# Patient Record
Sex: Male | Born: 1970 | Race: White | Hispanic: No | Marital: Married | Smoking: Former smoker
Health system: Southern US, Community
[De-identification: ages and names within clinical notes are randomized; demographics above are authoritative.]

## PROBLEM LIST (undated history)

## (undated) DIAGNOSIS — T7840XA Allergy, unspecified, initial encounter: Secondary | ICD-10-CM

## (undated) DIAGNOSIS — IMO0002 Reserved for concepts with insufficient information to code with codable children: Secondary | ICD-10-CM

## (undated) DIAGNOSIS — E119 Type 2 diabetes mellitus without complications: Secondary | ICD-10-CM

## (undated) DIAGNOSIS — M1990 Unspecified osteoarthritis, unspecified site: Secondary | ICD-10-CM

## (undated) DIAGNOSIS — I2510 Atherosclerotic heart disease of native coronary artery without angina pectoris: Secondary | ICD-10-CM

## (undated) DIAGNOSIS — K219 Gastro-esophageal reflux disease without esophagitis: Secondary | ICD-10-CM

## (undated) HISTORY — DX: Unspecified osteoarthritis, unspecified site: M19.90

## (undated) HISTORY — DX: Allergy, unspecified, initial encounter: T78.40XA

## (undated) HISTORY — DX: Reserved for concepts with insufficient information to code with codable children: IMO0002

## (undated) HISTORY — PX: HERNIA REPAIR: SHX51

## (undated) HISTORY — DX: Gastro-esophageal reflux disease without esophagitis: K21.9

## (undated) HISTORY — DX: Atherosclerotic heart disease of native coronary artery without angina pectoris: I25.10

## (undated) HISTORY — PX: SHOULDER SURGERY: SHX246

---

## 2001-01-22 ENCOUNTER — Emergency Department (HOSPITAL_COMMUNITY): Admission: EM | Admit: 2001-01-22 | Discharge: 2001-01-22 | Payer: Self-pay | Admitting: Emergency Medicine

## 2001-01-22 ENCOUNTER — Encounter: Payer: Self-pay | Admitting: Emergency Medicine

## 2001-01-25 ENCOUNTER — Ambulatory Visit (HOSPITAL_COMMUNITY): Admission: RE | Admit: 2001-01-25 | Discharge: 2001-01-25 | Payer: Self-pay | Admitting: Orthopedic Surgery

## 2006-12-29 ENCOUNTER — Ambulatory Visit (HOSPITAL_COMMUNITY): Admission: RE | Admit: 2006-12-29 | Discharge: 2006-12-30 | Payer: Self-pay | Admitting: Orthopedic Surgery

## 2010-06-08 NOTE — Op Note (Signed)
NAME:  Tim Richardson, Tim Richardson NO.:  [id number]   MEDICAL RECORD NO.:  [id number]          PATIENT TYPE:  OIB   LOCATION:  5013                         FACILITY:  [facility]   PHYSICIAN:  Dyke Brackett, M.D.    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   PREOPERATIVE DIAGNOSES:  1. Impingement.  2. Partial infraspinatus tear.  3. Labral tear.   POSTOPERATIVE DIAGNOSES:  1. Impingement.  2. Partial infraspinatus tear.  3. Labral tear.   OPERATION:  1. Arthroscopic acromioplasty.  2. Arthroscopic debridement, torn labrum.   SURGEON:  Dyke Brackett, M.D.   ANESTHESIA:  Is general with a block.   DESCRIPTION OF PROCEDURE:  Examination under anesthesia showed no  instability and full range of motion.  He was arthroscoped through a  posterior, lateral and anterior portal.  Intra-articularly, he had  fraying type tear of the labrum anterior to superior with good stability  of the biceps that was debrided.  He had a partial infraspinatus tear  seen on the MRI involving probably 30% of the width and thickness of the  infraspinatus which was debrided.  Supraspinatus was normal.  There was  no sign of any degenerative change or Hill-Sachs or Bankart lesion.   Subacromial space was entered.  It was hypertrophied and certainly tight  from a very thickened probably type 2 to 3 acromion.  Acromioplasty was  carried out resecting the anterior leading edge of the acromion  relieving the impingement nicely.  There was no evidence of full-  thickness components of the infraspinatus and a mild amount of abrasion  on the edge of the acromion consistent clinically with impingement.  Shoulder drained free of fluid.  Portals closed  with nylon and placed in  a lightly compressive dressing and sling, taken to recovery in stable  condition.     Dyke Brackett, M.D.  Electronically Signed    WDC/MEDQ  D:  12/29/2006  T:  12/30/2006  Job:  [id number]

## 2010-06-11 NOTE — H&P (Signed)
[facility]  Patient:    Tim Richardson, Tim Richardson Visit Number: [id number] MRN: [id number]          Service Type: DSU Location: DAY Attending Physician:  Dominica Severin Dictated by:   Dorie Rank, P.A. Admit Date:  01/25/2001                           History and Physical  CHIEF COMPLAINT:  Right hand pain.  HISTORY OF PRESENT ILLNESS:  Mr. Ezequiel Kayser is a pleasant 40 year old male who on January 16, 2001, sustained a puncture wound with a nail to the mid metatarsal metacarpal third on the right.  Since that time, he has had increased pain and swelling.  He was seen at his family doctors office and received a tetanus.  He was seen in the ER on January 22, 2001, where x-rays showed significant soft tissue swelling without evidence of foreign body, soft tissue gas, or fracture.  He was placed on oral Keflex and seen in the office today by Elisha Ponder, M.D., for follow-up on January 26, 2000.  He had significant pain and limitation with flexion to his third finger.  It was felt that he would benefit from undergoing an I&D to the third finger.  ALLERGIES:  No known drug allergies.  MEDICATIONS:  Vicodin and Keflex.  PAST MEDICAL HISTORY:  Significant for hiatal hernia.  He has a history of chronic prostate infections for the past five or six years.  No problems recently.  PAST SURGICAL HISTORY:  Negative.  SOCIAL HISTORY:  The patient is married and has one child.  He smokes one pack per day.  He denies any alcohol use.  FAMILY HISTORY:  Mother living, age 46, with a history of hypertension. Father living, age 28.  He does not know his medical history.  REVIEW OF SYSTEMS:  General:  No fevers, chills, night sweats, or bleeding tendencies.  Pulmonary:  Recent nasal congestion related to seasonal allergies, resolved now.  No shortness of breath or productive cough. Cardiovascular:  No chest pain, angina, or orthopnea.  Endocrine:  No hypothyroidism or  hyperthyroidism.  No history of diabetes mellitus. Neurologic:  No seizures, headaches, or paralysis.  GI:  No nausea, vomiting, diarrhea, constipation, or melena.  GU:  No hematuria, dysuria, or discharge.  PHYSICAL EXAMINATION:  GENERAL APPEARANCE:  An alert and oriented 41 year old male, well developed and well nourished.  VITAL SIGNS:  Pulse 80, respirations 18, blood pressure 110/80.  HEENT:  The head is normocephalic and atraumatic.  The oropharynx is clear. No postnasal drainage noted.  NECK:  Supple.  Negative for carotid bruits bilaterally.  CHEST:  The lungs are clear to auscultation bilaterally.  No rhonchi, rales, or wheezes.  BREASTS:  Not pertinent to present illness.  ADENOPATHY:  Negative for cervical or epitrochlear lymphadenopathy.  HEART:  S1 and S2 negative for murmurs, rubs, or gallops.  The heart is regular in rate and rhythm.  ABDOMEN:  Soft and nontender.  Positive bowel sounds.  GENITOURINARY:  Not pertinent to present illness.  EXTREMITIES:  He has a small healing laceration to the mid third metacarpal. He has pain with flexion of the middle finger and he can flex to approximately 20 degrees.  There is no purulent drainage from this laceration.  SKIN:  Otherwise intact other than what was discussed up above.  There is no erythema extending into the forearm.  LABORATORY DATA AND X-RAYS:  Pending at this  time.  IMPRESSION:  Tenosynovitis of the middle finger of the right hand, status post puncture wound on January 16, 2001.  PLAN:  The patient will be taken to the OR today for an I&D of his third finger and antibiotic therapy by Elisha Ponder, M.D. Dictated by:   Dorie Rank, P.A. Attending Physician:  Dominica Severin DD:  01/25/01 TD:  01/25/01 Job: [id number]

## 2010-06-11 NOTE — Op Note (Signed)
[facility]  Patient:    Tim Richardson, Tim Richardson Visit Number: [id number] MRN: [id number]          Service Type: DSU Location: DAY Attending Physician:  Dominica Severin Dictated by:   Dominica Severin III, M.D. Proc. Date: 01/25/01 Admit Date:  01/25/2001                             Operative Report  DATE OF BIRTH:  01/04/1971  PREOPERATIVE DIAGNOSES:  Status post nail injury to the right dorsal third MCP joint seven days ago with continued pain and loss of motion.  POSTOPERATIVE DIAGNOSES:  Status post nail injury to the right dorsal third MCP joint seven days ago with continued pain and loss of motion.  PROCEDURE:  Incision and drainage right third MCP joint about the upper extremity with soft tissue I&D and extensor tendon sheath I&D as well.  SURGEON:  Dominica Severin, M.D.  ASSISTANT:  None.  COMPLICATIONS:  None.  Cultures x 1 taken for aerobic and anaerobic cultures.  ESTIMATED BLOOD LOSS:  Minimal.  TOURNIQUET TIME:  Less than 30 minutes.  INDICATIONS FOR PROCEDURE:  The patient is a very pleasant 40 year old male who presents with the above mentioned diagnosis. The patient punctured his hand on Christmas Eve. He was seen in Urgent Care four to five days later and given a tetanus shot. He subsequently presented to [facility] due to increasing pain and swelling two days ago and was referred to my care. I saw him for the first time today, January 25, 2001. Given the swelling, duration of symptoms and location of the nail puncture at the MCP joint, I recommended I&D. He has pain on active and passive range of motion localized the MCP joint. He has no numbness or tingling, no signs of compartment syndrome. He has had increasing problems with the finger. I have discussed with him alternatives of care, risks and benefits of surgery, etc. at length and he desires to proceed. All questions have been encouraged and answered.  OPERATIVE  FINDINGS:  This patient had encroachment of the third MCP joint with the nail. The tract was dissected down to the MCP joint. Following this, the MCP joint underwent an I&D. There was a large amount of fluid and hypertrophic synovitis in the MCP joint. This was debrided and a capsulotomy, of course, performed. I I&Dd the area copiously, placed two vessiloops for drains.  DESCRIPTION OF PROCEDURE:  The patient was seen by myself and anesthesia. He was taken to the operative suite and underwent smooth induction of general anesthesia under the direction of Dr. Lucious Groves. He was then laid supine, appropriately padded and prepped and draped in the usual sterile fashion. The patient had an incision made after the arm was elevated and tourniquet was insufflated approximately 1 inch about the radial aspect of the third dorsal MCP joint. I removed the tract where the nail had previously penetrated the skin. I followed this straight down to the MCP joint where it encroached upon the MCP joint just in the area about the proximal sagittal band. I then lifted the sagittal band up carefully as well as the extensor tendon, performed a capsulotomy and removed a portion of the dorsal capsule about the finger. I then removed a large amount of hypertrophic synovium and took cultures. The patient has been on Keflex I should note. Following taking the culture, I then performed a I&D of subcutaneous  tissue and identified the extensor tendon sheath. The extensor tendon sheath did not have obvious pus in it. I did open it up and examine it. I was happy with the findings and the inspection and at this point applied copious irrigation to the MCP joint as well as extensor tendon and soft tissues. Copious amounts of irrigant were placed in the wound, the tourniquet was deflated and antibiotics in the form of 1 gm of Rocephin was given at that time. The patient had the joint ranged during the irrigation portion of the  procedure. This was done to my satisfaction. Following an excellent I&D, the patient then had two vessiloops placed in the joint and the wound loosely closed. He tolerated this well. Marcaine without epinephrine was placed in the skin for postop analgesia. Once the sterile dressing and volar splint was placed, the patient then was transferred to the recovery room in stable condition after extubation. All sponge, needle and instrument counts were reported as correct. There were no complications. Will monitor his condition closely. He is going to go home on Augmentin 875 mg one p.o. b.i.d. I will await cultures. I have also given him pain medicine to be taken as directed. All questions have been encouraged and answered. Dictated by:   Dominica Severin III, M.D. Attending Physician:  Dominica Severin DD:  01/25/01 TD:  01/26/01 Job: [id number]

## 2010-11-01 LAB — DIFFERENTIAL
Basophils Absolute: 0.1
Basophils Relative: 1
Eosinophils Absolute: 0.2
Eosinophils Relative: 3
Lymphocytes Relative: 40
Lymphs Abs: 2.9
Monocytes Absolute: 0.6
Monocytes Relative: 8
Neutro Abs: 3.5
Neutrophils Relative %: 48

## 2010-11-01 LAB — URINALYSIS, ROUTINE W REFLEX MICROSCOPIC
Bilirubin Urine: NEGATIVE
Glucose, UA: NEGATIVE
Hgb urine dipstick: NEGATIVE
Ketones, ur: NEGATIVE
Nitrite: NEGATIVE
Protein, ur: NEGATIVE
Specific Gravity, Urine: 1.017
Urobilinogen, UA: 0.2
pH: 7

## 2010-11-01 LAB — BASIC METABOLIC PANEL
BUN: 9
CO2: 30
Calcium: 9.5
Chloride: 102
Creatinine, Ser: 1.02
GFR calc Af Amer: >60
GFR calc non Af Amer: >60
Glucose, Bld: 100 — ABNORMAL HIGH
Potassium: 4.4
Sodium: 140

## 2010-11-01 LAB — CBC
HCT: 43.7
Hemoglobin: 14.9
MCHC: 34
MCV: 84.6
Platelets: 207
RBC: 5.17
RDW: 13.7
WBC: 7.2

## 2012-06-25 ENCOUNTER — Emergency Department: Payer: Self-pay | Admitting: Emergency Medicine

## 2012-06-25 LAB — COMPREHENSIVE METABOLIC PANEL
Albumin: 4.2 g/dL (ref 3.4–5.0)
Alkaline Phosphatase: 71 U/L (ref 50–136)
Anion Gap: 6 — ABNORMAL LOW (ref 7–16)
Bilirubin,Total: 0.3 mg/dL (ref 0.2–1.0)
Calcium, Total: 9.1 mg/dL (ref 8.5–10.1)
Chloride: 104 mmol/L (ref 98–107)
Glucose: 102 mg/dL — ABNORMAL HIGH (ref 65–99)
Osmolality: 280 (ref 275–301)
SGOT(AST): 29 U/L (ref 15–37)
Total Protein: 7.5 g/dL (ref 6.4–8.2)

## 2012-06-25 LAB — CBC
HCT: 43.3 % (ref 40.0–52.0)
HGB: 15 g/dL (ref 13.0–18.0)
MCH: 29 pg (ref 26.0–34.0)
MCHC: 34.7 g/dL (ref 32.0–36.0)
MCV: 84 fL (ref 80–100)
RDW: 14 % (ref 11.5–14.5)
WBC: 13.3 10*3/uL — ABNORMAL HIGH (ref 3.8–10.6)

## 2012-12-21 ENCOUNTER — Other Ambulatory Visit: Payer: Self-pay | Admitting: *Deleted

## 2012-12-21 ENCOUNTER — Ambulatory Visit (INDEPENDENT_AMBULATORY_CARE_PROVIDER_SITE_OTHER): Payer: 59 | Admitting: Physician Assistant

## 2012-12-21 VITALS — BP 116/74 | HR 94 | Temp 98.8°F | Resp 16 | Wt 254.8 lb

## 2012-12-21 DIAGNOSIS — J209 Acute bronchitis, unspecified: Secondary | ICD-10-CM

## 2012-12-21 MED ORDER — CEFDINIR 300 MG PO CAPS: 300.0000 mg | ORAL_CAPSULE | Freq: Two times a day (BID) | ORAL | Status: DC

## 2012-12-21 MED ORDER — GUAIFENESIN ER 1200 MG PO TB12: 1.0000 | ORAL_TABLET | Freq: Two times a day (BID) | ORAL | Status: AC

## 2012-12-21 MED ORDER — GUAIFENESIN ER 1200 MG PO TB12: 1.0000 | ORAL_TABLET | Freq: Two times a day (BID) | ORAL | Status: DC

## 2012-12-21 MED ORDER — HYDROCOD POLST-CHLORPHEN POLST 10-8 MG/5ML PO LQCR: 5.0000 mL | Freq: Two times a day (BID) | ORAL | Status: DC

## 2012-12-21 NOTE — Progress Notes (Signed)
   Subjective:    Patient ID: Tim Richardson, male    DOB: Apr 21, 1970, 42 y.o.   MRN: [id number]  HPI Pt presents to clinic with 2 week h/o cold symptoms - he is having some nasal congestion but it is mainly at night and sinus headaches - but the worse is his cough - it is mostly dry but it is deep.  He has no SOB or wheezing.    OTC med- tylenol cold prep - not much help Sick contacts at home. Review of Systems  Constitutional: Positive for fever (subjective). Negative for chills.  HENT: Positive for congestion (at night), ear pain, sinus pressure and sore throat (am only - then resolves).   Respiratory: Positive for cough (yellow - green - rare). Negative for shortness of breath and wheezing.        Former smoker. No h/o asthma  Musculoskeletal: Positive for myalgias.  Neurological: Positive for headaches.  Psychiatric/Behavioral: Positive for sleep disturbance (cough keeping him up at night).       Objective:   Physical Exam  Vitals reviewed. Constitutional: He is oriented to person, place, and time. He appears well-developed and well-nourished.  HENT:  Head: Normocephalic and atraumatic.  Right Ear: Hearing, tympanic membrane, external ear and ear canal normal.  Left Ear: Hearing, tympanic membrane, external ear and ear canal normal.  Nose: Nose normal.  Mouth/Throat: Uvula is midline, oropharynx is clear and moist and mucous membranes are normal.  Eyes: Conjunctivae are normal.  Neck: Normal range of motion.  Cardiovascular: Normal rate, regular rhythm and normal heart sounds.   No murmur heard. Pulmonary/Chest: Effort normal. He has no wheezes.  Lymphadenopathy:    He has no cervical adenopathy.  Neurological: He is alert and oriented to person, place, and time.  Skin: Skin is warm and dry.  Psychiatric: He has a normal mood and affect. His behavior is normal. Judgment and thought content normal.       Assessment & Plan:  Acute bronchitis - Will cover for sinus and  bronchitis.  He is to push fluids.  PMG TB12, chlorpheniramine-HYDROcodone [facility] ER) 10-8 MG/5ML Mare Ferrari   Benny Lennert PA-C 12/21/2012 2:07 PM

## 2012-12-30 ENCOUNTER — Ambulatory Visit (INDEPENDENT_AMBULATORY_CARE_PROVIDER_SITE_OTHER): Payer: 59 | Admitting: Internal Medicine

## 2012-12-30 ENCOUNTER — Ambulatory Visit: Payer: 59

## 2012-12-30 VITALS — BP 110/72 | HR 97 | Temp 98.8°F | Resp 16 | Ht 74.7 in | Wt 252.0 lb

## 2012-12-30 DIAGNOSIS — J209 Acute bronchitis, unspecified: Secondary | ICD-10-CM

## 2012-12-30 DIAGNOSIS — R079 Chest pain, unspecified: Secondary | ICD-10-CM

## 2012-12-30 DIAGNOSIS — R05 Cough: Secondary | ICD-10-CM

## 2012-12-30 DIAGNOSIS — R069 Unspecified abnormalities of breathing: Secondary | ICD-10-CM

## 2012-12-30 LAB — POCT CBC
Lymph, poc: 1.5 (ref 0.6–3.4)
MCH, POC: 28.7 pg (ref 27–31.2)
MCHC: 32.1 g/dL (ref 31.8–35.4)
MID (cbc): 0.5 (ref 0–0.9)
MPV: 9.8 fL (ref 0–99.8)
POC LYMPH PERCENT: 22.5 %L (ref 10–50)
POC MID %: 7.9 %M (ref 0–12)
Platelet Count, POC: 188 10*3/uL (ref 142–424)
RBC: 4.78 M/uL (ref 4.69–6.13)
RDW, POC: 13.9 %
WBC: 6.5 10*3/uL (ref 4.6–10.2)

## 2012-12-30 MED ORDER — HYDROCOD POLST-CHLORPHEN POLST 10-8 MG/5ML PO LQCR: 5.0000 mL | Freq: Two times a day (BID) | ORAL | Status: AC

## 2012-12-30 MED ORDER — AZITHROMYCIN 500 MG PO TABS: 500.0000 mg | ORAL_TABLET | Freq: Every day | ORAL | Status: DC

## 2012-12-30 NOTE — Progress Notes (Signed)
   Subjective:    Patient ID: Tim Richardson, male    DOB: September 26, 1970, 42 y.o.   MRN: [id number]  HPI Cough getting worse, now chest pain and copious green sputum. No sob.No hemoptysis.   Review of Systems     Objective:   Physical Exam  Vitals reviewed. Constitutional: He is oriented to person, place, and time. He appears well-developed and well-nourished. No distress.  Cardiovascular: Normal rate, regular rhythm and normal heart sounds.   Pulmonary/Chest: Effort normal. Not tachypneic. No respiratory distress. He has no decreased breath sounds. He has no wheezes. He has rhonchi. He has no rales.  Neurological: He is alert and oriented to person, place, and time. He exhibits normal muscle tone. Coordination normal.  Psychiatric: He has a normal mood and affect.     Results for orders placed in visit on 12/30/12  POCT CBC      Result Value Range   WBC 6.5  4.6 - 10.2 K/uL   Lymph, poc 1.5  0.6 - 3.4   POC LYMPH PERCENT 22.5  10 - 50 %L   MID (cbc) 0.5  0 - 0.9   POC MID % 7.9  0 - 12 %M   POC Granulocyte 4.5  2 - 6.9   Granulocyte percent 69.6  37 - 80 %G   RBC 4.78  4.69 - 6.13 M/uL   Hemoglobin 13.7 (*) 14.1 - 18.1 g/dL   HCT, POC 40.9 (*) 81.1 - 53.7 %   MCV 89.3  80 - 97 fL   MCH, POC 28.7  27 - 31.2 pg   MCHC 32.1  31.8 - 35.4 g/dL   RDW, POC 91.4     Platelet Count, POC 188  142 - 424 K/uL   MPV 9.8  0 - 99.8 fL   [facility] reading (PRIMARY) by  Dr Perrin Maltese no infiltrate       Assessment & Plan:  Zithromax 500mg /Tussionex cough

## 2012-12-30 NOTE — Patient Instructions (Signed)
Cough, Adult  A cough is a reflex that helps clear your throat and airways. It can help heal the body or may be a reaction to an irritated airway. A cough may only last 2 or 3 weeks (acute) or may last more than 8 weeks (chronic).  CAUSES Acute cough:  Viral or bacterial infections. Chronic cough:  Infections.  Allergies.  Asthma.  Post-nasal drip.  Smoking.  Heartburn or acid reflux.  Some medicines.  Chronic lung problems (COPD).  Cancer. SYMPTOMS   Cough.  Fever.  Chest pain.  Increased breathing rate.  High-pitched whistling sound when breathing (wheezing).  Colored mucus that you cough up (sputum). TREATMENT   A bacterial cough may be treated with antibiotic medicine.  A viral cough must run its course and will not respond to antibiotics.  Your caregiver may recommend other treatments if you have a chronic cough. HOME CARE INSTRUCTIONS   Only take over-the-counter or prescription medicines for pain, discomfort, or fever as directed by your caregiver. Use cough suppressants only as directed by your caregiver.  Use a cold steam vaporizer or humidifier in your bedroom or home to help loosen secretions.  Sleep in a semi-upright position if your cough is worse at night.  Rest as needed.  Stop smoking if you smoke. SEEK IMMEDIATE MEDICAL CARE IF:   You have pus in your sputum.  Your cough starts to worsen.  You cannot control your cough with suppressants and are losing sleep.  You begin coughing up blood.  You have difficulty breathing.  You develop pain which is getting worse or is uncontrolled with medicine.  You have a fever. MAKE SURE YOU:   Understand these instructions.  Will watch your condition.  Will get help right away if you are not doing well or get worse. Document Released: 07/09/2010 Document Revised: 04/04/2011 Document Reviewed: 07/09/2010 Exit[employer] Patient Information 2014 [employer]. Acute Bronchitis Bronchitis is  inflammation of the airways that extend from the windpipe into the lungs (bronchi). The inflammation often causes mucus to develop. This leads to a cough, which is the most common symptom of bronchitis.  In acute bronchitis, the condition usually develops suddenly and goes away over time, usually in a couple weeks. Smoking, allergies, and asthma can make bronchitis worse. Repeated episodes of bronchitis may cause further lung problems.  CAUSES Acute bronchitis is most often caused by the same virus that causes a cold. The virus can spread from person to person (contagious).  SIGNS AND SYMPTOMS   Cough.   Fever.   Coughing up mucus.   Body aches.   Chest congestion.   Chills.   Shortness of breath.   Sore throat.  DIAGNOSIS  Acute bronchitis is usually diagnosed through a physical exam. Tests, such as chest X-rays, are sometimes done to rule out other conditions.  TREATMENT  Acute bronchitis usually goes away in a couple weeks. Often times, no medical treatment is necessary. Medicines are sometimes given for relief of fever or cough. Antibiotics are usually not needed but may be prescribed in certain situations. In some cases, an inhaler may be recommended to help reduce shortness of breath and control the cough. A cool mist vaporizer may also be used to help thin bronchial secretions and make it easier to clear the chest.  HOME CARE INSTRUCTIONS  Get plenty of rest.   Drink enough fluids to keep your urine clear or pale yellow (unless you have a medical condition that requires fluid restriction). Increasing fluids may help   thin your secretions and will prevent dehydration.   Only take over-the-counter or prescription medicines as directed by your health care provider.   Avoid smoking and secondhand smoke. Exposure to cigarette smoke or irritating chemicals will make bronchitis worse. If you are a smoker, consider using nicotine gum or skin patches to help control  withdrawal symptoms. Quitting smoking will help your lungs heal faster.   Reduce the chances of another bout of acute bronchitis by washing your hands frequently, avoiding people with cold symptoms, and trying not to touch your hands to your mouth, nose, or eyes.   Follow up with your health care provider as directed.  SEEK MEDICAL CARE IF: Your symptoms do not improve after 1 week of treatment.  SEEK IMMEDIATE MEDICAL CARE IF:  You develop an increased fever or chills.   You have chest pain.   You have severe shortness of breath.  You have bloody sputum.   You develop dehydration.  You develop fainting.  You develop repeated vomiting.  You develop a severe headache. MAKE SURE YOU:   Understand these instructions.  Will watch your condition.  Will get help right away if you are not doing well or get worse. Document Released: 02/18/2004 Document Revised: 09/12/2012 Document Reviewed: 07/03/2012 Exit[employer] Patient Information 2014 [employer].  

## 2013-07-08 ENCOUNTER — Encounter (HOSPITAL_COMMUNITY): Payer: Self-pay | Admitting: Emergency Medicine

## 2013-07-08 ENCOUNTER — Emergency Department (HOSPITAL_COMMUNITY)
Admission: EM | Admit: 2013-07-08 | Discharge: 2013-07-08 | Disposition: A | Discharge status: Home / Self Care | Payer: Self-pay | Attending: Emergency Medicine | Admitting: Emergency Medicine

## 2013-07-08 DIAGNOSIS — R1012 Left upper quadrant pain: Secondary | ICD-10-CM | POA: Insufficient documentation

## 2013-07-08 DIAGNOSIS — Z87891 Personal history of nicotine dependence: Secondary | ICD-10-CM | POA: Insufficient documentation

## 2013-07-08 DIAGNOSIS — Z872 Personal history of diseases of the skin and subcutaneous tissue: Secondary | ICD-10-CM | POA: Insufficient documentation

## 2013-07-08 DIAGNOSIS — R109 Unspecified abdominal pain: Secondary | ICD-10-CM

## 2013-07-08 DIAGNOSIS — Z79899 Other long term (current) drug therapy: Secondary | ICD-10-CM | POA: Insufficient documentation

## 2013-07-08 DIAGNOSIS — R197 Diarrhea, unspecified: Secondary | ICD-10-CM | POA: Insufficient documentation

## 2013-07-08 DIAGNOSIS — K219 Gastro-esophageal reflux disease without esophagitis: Secondary | ICD-10-CM | POA: Insufficient documentation

## 2013-07-08 DIAGNOSIS — Z8739 Personal history of other diseases of the musculoskeletal system and connective tissue: Secondary | ICD-10-CM | POA: Insufficient documentation

## 2013-07-08 LAB — CBC WITH DIFFERENTIAL/PLATELET
Basophils Absolute: 0.1 10*3/uL (ref 0.0–0.1)
Basophils Relative: 1 % (ref 0–1)
EOS PCT: 3 % (ref 0–5)
Eosinophils Absolute: 0.2 10*3/uL (ref 0.0–0.7)
HEMATOCRIT: 43.3 % (ref 39.0–52.0)
Hemoglobin: 15 g/dL (ref 13.0–17.0)
LYMPHS ABS: 3 10*3/uL (ref 0.7–4.0)
Lymphocytes Relative: 41 % (ref 12–46)
MCH: 29.5 pg (ref 26.0–34.0)
MCHC: 34.6 g/dL (ref 30.0–36.0)
MCV: 85.1 fL (ref 78.0–100.0)
MONO ABS: 0.8 10*3/uL (ref 0.1–1.0)
Monocytes Relative: 11 % (ref 3–12)
NEUTROS ABS: 3.3 10*3/uL (ref 1.7–7.7)
Neutrophils Relative %: 44 % (ref 43–77)
Platelets: 201 10*3/uL (ref 150–400)
RBC: 5.09 MIL/uL (ref 4.22–5.81)
RDW: 13.7 % (ref 11.5–15.5)
WBC: 7.3 10*3/uL (ref 4.0–10.5)

## 2013-07-08 LAB — COMPREHENSIVE METABOLIC PANEL
ALK PHOS: 67 U/L (ref 39–117)
ALT: 56 U/L — ABNORMAL HIGH (ref 0–53)
AST: 32 U/L (ref 0–37)
Albumin: 4.1 g/dL (ref 3.5–5.2)
BUN: 15 mg/dL (ref 6–23)
CALCIUM: 9.6 mg/dL (ref 8.4–10.5)
CHLORIDE: 102 meq/L (ref 96–112)
CO2: 28 mEq/L (ref 19–32)
Creatinine, Ser: 1.18 mg/dL (ref 0.50–1.35)
GFR, EST AFRICAN AMERICAN: 85 mL/min — AB (ref >90)
GFR, EST NON AFRICAN AMERICAN: 74 mL/min — AB (ref >90)
GLUCOSE: 118 mg/dL — AB (ref 70–99)
Potassium: 5 mEq/L (ref 3.7–5.3)
Sodium: 140 mEq/L (ref 137–147)
Total Bilirubin: 0.3 mg/dL (ref 0.3–1.2)
Total Protein: 7.4 g/dL (ref 6.0–8.3)

## 2013-07-08 LAB — LIPASE, BLOOD: Lipase: 35 U/L (ref 11–59)

## 2013-07-08 NOTE — ED Notes (Signed)
Pt c/o generalized abdominal pain ongoing for 6 weeks. Symptoms increased past couple of days. Pt also reports that his stomach becomes bloated whenever he eats.

## 2013-07-08 NOTE — Discharge Instructions (Signed)

## 2013-07-08 NOTE — ED Provider Notes (Signed)
CSN: [id number]     Arrival date & time 07/08/13  0711 History   First MD Initiated Contact with Patient 07/08/13 [phone]     Chief Complaint  Patient presents with  . Abdominal Pain    Pt seen with medical student, I performed history/physical/documentation     Patient is a 43 y.o. male presenting with abdominal pain. The history is provided by the patient.  Abdominal Pain Pain location:  LUQ Pain quality: burning   Pain radiates to:  Does not radiate Pain severity:  Moderate Onset quality:  Gradual Duration:  6 weeks Timing:  Intermittent Progression:  Unchanged Relieved by:  Nothing Ineffective treatments:  Eating Associated symptoms: diarrhea   Associated symptoms: no chest pain, no dysuria, no fever, no hematochezia, no shortness of breath and no vomiting   Risk factors: no alcohol abuse and no NSAID use     Past Medical History  Diagnosis Date  . Allergy   . Arthritis   . GERD (gastroesophageal reflux disease)   . Ulcer    Past Surgical History  Procedure Laterality Date  . Hernia repair    . Shoulder surgery     Family History  Problem Relation Age of Onset  . Diabetes Mother   . Heart disease Mother   . Stroke Mother   . Heart disease Father   . Hyperlipidemia Father   . Heart disease Sister   . Mental retardation Sister    History  Substance Use Topics  . Smoking status: Former Smoker    Quit date: 12/21/2001  . Smokeless tobacco: Not on file  . Alcohol Use: No    Review of Systems  Constitutional: Negative for fever.  Respiratory: Negative for shortness of breath.   Cardiovascular: Negative for chest pain.  Gastrointestinal: Positive for abdominal pain and diarrhea. Negative for vomiting, blood in stool and hematochezia.  Genitourinary: Negative for dysuria.  All other systems reviewed and are negative.     Allergies  Review of patient's allergies indicates no known allergies.  Home Medications   Prior to Admission medications    Medication Sig Start Date End Date Taking? Authorizing Provider  acetaminophen (TYLENOL) 500 MG tablet Take 500 mg by mouth every 6 (six) hours as needed for headache.   Yes Historical Provider, MD  pantoprazole (PROTONIX) 20 MG tablet Take 40 mg by mouth 2 (two) times daily. May take addt'l  If needed in the evening   Yes Historical Provider, MD  Simethicone 125 MG CAPS Take 1-2 capsules by mouth every 6 (six) hours as needed (gas).   Yes Historical Provider, MD   BP 127/82  Pulse 83  Temp(Src) 97.7 F (36.5 C) (Oral)  Resp 18  Ht 6\' 3"  (1.905 m)  Wt 260 lb (117.935 kg)  BMI 32.50 kg/m2  SpO2 99% Physical Exam CONSTITUTIONAL: Well developed/well nourished HEAD: Normocephalic/atraumatic EYES: EOMI/PERRL, no icterus ENMT: Mucous membranes moist NECK: supple no meningeal signs SPINE:entire spine nontender CV: S1/S2 noted, no murmurs/rubs/gallops noted LUNGS: Lungs are clear to auscultation bilaterally, no apparent distress ABDOMEN: soft, nontender, no rebound or guarding, no hernia noted GU:no cva tenderness NEURO: Pt is awake/alert, moves all extremitiesx4 EXTREMITIES: pulses normal, full ROM SKIN: warm, color normal PSYCH: no abnormalities of mood noted  ED Course  Procedures   Advised PPI Referred to GI He is well appearing, I don't feel emergent imaging required at this time  Labs Review Labs Reviewed  COMPREHENSIVE METABOLIC PANEL - Abnormal; Notable for the following:  Glucose, Bld 118 (*)    ALT 56 (*)    GFR calc non Af Amer 74 (*)    GFR calc Af Amer 85 (*)    All other components within normal limits  CBC WITH DIFFERENTIAL  LIPASE, BLOOD      EKG Interpretation   Date/Time:  Monday July 08 2013 08:17:42 EDT Ventricular Rate:  75 PR Interval:  181 QRS Duration: 98 QT Interval:  384 QTC Calculation: 429 R Axis:   82 Text Interpretation:  Sinus rhythm Abnormal inferior Q waves Minimal ST  elevation, inferior leads No previous ECGs available  Confirmed by Bebe ShaggyWICKLINE   MD, Dorinda HillNALD ([id number]) on 07/08/2013 8:23:31 AM      MDM   Final diagnoses:  Abdominal pain    Nursing notes including past medical history and social history reviewed and considered in documentation Labs/vital reviewed and considered     Joya Gaskinsonald W Cordie Beazley, MD 07/08/13 [phone]

## 2013-07-23 ENCOUNTER — Ambulatory Visit: Payer: Self-pay

## 2015-04-14 ENCOUNTER — Ambulatory Visit (INDEPENDENT_AMBULATORY_CARE_PROVIDER_SITE_OTHER): Payer: PRIVATE HEALTH INSURANCE | Admitting: Family Medicine

## 2015-04-14 VITALS — BP 132/80 | HR 75 | Temp 97.9°F | Resp 18 | Ht 75.0 in | Wt 268.8 lb

## 2015-04-14 DIAGNOSIS — B9689 Other specified bacterial agents as the cause of diseases classified elsewhere: Secondary | ICD-10-CM

## 2015-04-14 DIAGNOSIS — J019 Acute sinusitis, unspecified: Secondary | ICD-10-CM

## 2015-04-14 MED ORDER — DOXYCYCLINE HYCLATE 100 MG PO CAPS: 100.0000 mg | ORAL_CAPSULE | Freq: Two times a day (BID) | ORAL | Status: DC

## 2015-04-14 NOTE — Progress Notes (Signed)
Tim Richardson is a 45 y.o. male who presents today for Sinus pressure.  Sinus pressure - Ongoing now for about 3 weeks with HA and sinus pressure.  Has not tried much for this other than mucinex and allergy medication.  Has helped a little bit.  No fever. Has had some nasal drainage that is greenish in color.  No Respiratory Sx.  Adjustment disorder with grief -  Pt lost daughter about 8 months ago.  Having hard time and denies SI/HI.  Trouble with daily life.  Not interested in therapy or medications.     Past Medical History  Diagnosis Date  . Allergy   . Arthritis   . GERD (gastroesophageal reflux disease)   . Ulcer     History  Smoking status  . Former Smoker  . Quit date: 12/21/2001  Smokeless tobacco  . Not on file    Family History  Problem Relation Age of Onset  . Diabetes Mother   . Heart disease Mother   . Stroke Mother   . Heart disease Father   . Hyperlipidemia Father   . Heart disease Sister   . Mental retardation Sister     Current Outpatient Prescriptions on File Prior to Visit  Medication Sig Dispense Refill  . acetaminophen (TYLENOL) 500 MG tablet Take 500 mg by mouth every 6 (six) hours as needed for headache.    . pantoprazole (PROTONIX) 20 MG tablet Take 40 mg by mouth 2 (two) times daily. Reported on 04/14/2015    . Simethicone 125 MG CAPS Take 1-2 capsules by mouth every 6 (six) hours as needed (gas). Reported on 04/14/2015     No current facility-administered medications on file prior to visit.    ROS: Per HPI.  All other systems reviewed and are negative.   Physical Exam Filed Vitals:   04/14/15 1622  BP: 132/80  Pulse: 75  Temp: 97.9 F (36.6 C)  Resp: 18    Physical Examination: General appearance - alert, well appearing, and in no distress Nose - normal and patent, no erythema, discharge or polyps Mouth - mucous membranes moist, pharynx normal without lesions Chest - clear to auscultation, no wheezes, rales or rhonchi, symmetric air  entry Heart - normal rate and regular rhythm  Assessment/Plan -  1) Allergic vs bacterial sinusitis - Pt does have hx of allergic rhinitis so may very well be start of allergies.  However, with HA, nasal greenish drainage, trial of mucinex/flonase with minimal relief, will go ahead and tx for bacterial rhinosinusitis.  As well, recommended starting afrin.   2) Adjustment disorder with grief - Expected course of grief at this point with loss of her daughter under a year.  No SI/HI, if interested in counseling, he will call.   F/U PRN

## 2015-09-17 ENCOUNTER — Other Ambulatory Visit: Payer: Self-pay | Admitting: Occupational Medicine

## 2015-09-17 ENCOUNTER — Ambulatory Visit: Payer: Self-pay

## 2015-09-17 DIAGNOSIS — M25511 Pain in right shoulder: Secondary | ICD-10-CM

## 2016-01-05 ENCOUNTER — Ambulatory Visit (INDEPENDENT_AMBULATORY_CARE_PROVIDER_SITE_OTHER): Payer: PRIVATE HEALTH INSURANCE

## 2016-01-05 ENCOUNTER — Ambulatory Visit (INDEPENDENT_AMBULATORY_CARE_PROVIDER_SITE_OTHER): Payer: Worker's Compensation | Admitting: Family Medicine

## 2016-01-05 VITALS — BP 116/82 | HR 80 | Temp 98.2°F | Resp 17 | Ht 75.0 in | Wt 251.0 lb

## 2016-01-05 DIAGNOSIS — Z130 Encounter for screening for diseases of the blood and blood-forming organs and certain disorders involving the immune mechanism: Secondary | ICD-10-CM | POA: Diagnosis not present

## 2016-01-05 DIAGNOSIS — Z131 Encounter for screening for diabetes mellitus: Secondary | ICD-10-CM

## 2016-01-05 DIAGNOSIS — Z87891 Personal history of nicotine dependence: Secondary | ICD-10-CM | POA: Diagnosis not present

## 2016-01-05 DIAGNOSIS — Z01818 Encounter for other preprocedural examination: Secondary | ICD-10-CM

## 2016-01-05 NOTE — Progress Notes (Signed)
Subjective:  By signing my name below, I, Stann Oresung-Kai Tsai, attest that this documentation has been prepared under the direction and in the presence of Meredith StaggersJeffrey Sumayyah Custodio, MD. Electronically Signed: Stann Oresung-Kai Tsai, Scribe. 01/05/2016 , 12:01 PM .  Patient was seen in Room 14 .   Patient ID: Tim Richardson, male    DOB: 1970-06-28, 45 y.o.   MRN: [id number] Chief Complaint  Patient presents with  . Medical Clearance    Shoulder surgery    HPI Tim Richardson is a 45 y.o. male  Patient is here for medical clearance for shoulder surgery; intermediate risk surgery.   Patient reports injuring his right shoulder at work on Aug 17th. [facility] and plans to have surgery done with Dr. Ranell PatrickNorris. He is scheduled for shoulder orthoscopy, distal cervical resection, and rotator cuff repair. He is a former smoker for 14 years, but quit 12 years ago. Surgery is planned for Dec 18th.   He denies being on chronic medications in the past. He denies lightheadedness or dizziness with exertion and exercise. He denies heart problems in the past or heart failure. He denies history of strokes or renal insufficiency. He denies history of diabetes or use of insulin. Goldman risk index is 0.   Previous surgery history includes right rotator cuff repair (done in 2008) and hernia. He was okay with anesthesia. He denies history use of Cpap machine or known OSA. His most exercise prior to injury was playing softball twice a week. Metabolic equivalence of 7, possibly greater. He works for [employer], doing heavy lifting  No Known Allergies  Prior to Admission medications   Medication Sig Start Date End Date Taking? Authorizing Provider  acetaminophen (TYLENOL) 500 MG tablet Take 500 mg by mouth every 6 (six) hours as needed for headache.   Yes Historical Provider, MD  cetirizine (ZYRTEC) 10 MG tablet Take 10 mg by mouth daily.   Yes Historical Provider, MD  ibuprofen (ADVIL,MOTRIN)  200 MG tablet Take 200 mg by mouth every 6 (six) hours as needed.   Yes Historical Provider, MD  pantoprazole (PROTONIX) 20 MG tablet Take 40 mg by mouth 2 (two) times daily. Reported on 04/14/2015   Yes Historical Provider, MD  Simethicone 125 MG CAPS Take 1-2 capsules by mouth every 6 (six) hours as needed (gas). Reported on 04/14/2015   Yes Historical Provider, MD   Review of Systems  Constitutional: Negative for fatigue and unexpected weight change.  Eyes: Negative for visual disturbance.  Respiratory: Negative for cough, chest tightness and shortness of breath.   Cardiovascular: Negative for chest pain, palpitations and leg swelling.  Gastrointestinal: Negative for abdominal pain and blood in stool.  Musculoskeletal: Positive for arthralgias and myalgias.  Neurological: Negative for dizziness, light-headedness and headaches.       Objective:   Physical Exam  Constitutional: He is oriented to person, place, and time. He appears well-developed and well-nourished.  HENT:  Head: Normocephalic and atraumatic.  Eyes: EOM are normal. Pupils are equal, round, and reactive to light.  Neck: No JVD present. Carotid bruit is not present.  Cardiovascular: Normal rate, regular rhythm and normal heart sounds.   No murmur heard. Pulmonary/Chest: Effort normal and breath sounds normal. He has no rales.  Musculoskeletal: He exhibits no edema.  Neurological: He is alert and oriented to person, place, and time.  Skin: Skin is warm and dry.  Psychiatric: He has a normal mood and affect.  Vitals reviewed.   Vitals:  01/05/16 1137  BP: 116/82  Pulse: 80  Resp: 17  Temp: 98.2 F (36.8 C)  TempSrc: Oral  SpO2: 98%  Weight: 251 lb (113.9 kg)  Height: 6\' 3"  (1.905 m)   EKG: sinus rhythm, no acute findings  Dg Chest 2 View  Result Date: 01/05/2016 CLINICAL DATA:  Preoperative evaluation.  History of tobacco use EXAM: CHEST  2 VIEW COMPARISON:  December 30, 2012 FINDINGS: There is no edema or  consolidation. Heart size and pulmonary vascularity are normal. No adenopathy. No bone lesions. IMPRESSION: No edema or consolidation. Electronically Signed   By: Bretta BangWilliam  Woodruff III M.D.   On: 01/05/2016 12:12   Results for orders placed or performed in visit on 01/05/16  CBC  Result Value Ref Range   WBC 9.5 3.4 - 10.8 x10E3/uL   RBC 5.19 4.14 - 5.80 x10E6/uL   Hemoglobin 15.4 13.0 - 17.7 g/dL   Hematocrit 13.043.9 86.537.5 - 51.0 %   MCV 85 79 - 97 fL   MCH 29.7 26.6 - 33.0 pg   MCHC 35.1 31.5 - 35.7 g/dL   RDW 78.414.4 69.612.3 - 29.515.4 %   Platelets 214 150 - 379 x10E3/uL  Basic metabolic panel  Result Value Ref Range   Glucose 96 65 - 99 mg/dL   BUN 10 6 - 24 mg/dL   Creatinine, Ser 2.841.00 0.76 - 1.27 mg/dL   GFR calc non Af Amer 89 >59 mL/min/1.73   GFR calc Af Amer 103 >59 mL/min/1.73   BUN/Creatinine Ratio 10 9 - 20   Sodium 143 134 - 144 mmol/L   Potassium 4.7 3.5 - 5.2 mmol/L   Chloride 100 96 - 106 mmol/L   CO2 27 18 - 29 mmol/L   Calcium 9.6 8.7 - 10.2 mg/dL        Assessment & Plan:   Tim Richardson is a 45 y.o. male Preoperative clearance - Plan: EKG 12-Lead, CBC, Basic metabolic panel, DG Chest 2 View  Screening for diabetes mellitus - Plan: Basic metabolic panel  Screening, anemia, deficiency, iron - Plan: CBC  History of tobacco use - Plan: DG Chest 2 View  Preoperative clearance. Able to exercise with sufficient mets without chest pain and dyspnea or other difficulty. With remote history of tobacco abuse, chest x-ray was obtained without acute findings. CBC and BMP reassuring, no sign of renal disease, no concerning findings on history or exam. Paperwork will be completed and faxed to  Dr. Ranell PatrickNorris for clearance for surgery as no apparent increased risk for major adverse cardiac event.   No orders of the defined types were placed in this encounter.  Patient Instructions   I anticipate your blood work will be okay, and will likely be able to fax your preoperative  clearance form to [facility] in next few days. I do not anticipate a problem with surgery on the 18th at this time. Let me know if you have any questions.   IF you received an x-ray today, you will receive an invoice from [facility]. Please contact [facility] at [phone] with questions or concerns regarding your invoice.   IF you received labwork today, you will receive an invoice from [facility]. Please contact [facility] at [phone] with questions or concerns regarding your invoice.   Our billing staff will not be able to assist you with questions regarding bills from these companies.  You will be contacted with the lab results as soon as they are available. The fastest way to  get your results is to activate your My Chart account. Instructions are located on the last page of this paperwork. If you have not heard from [place] regarding the results in 2 weeks, please contact this office.        I personally performed the services described in this documentation, which was scribed in my presence. The recorded information has been reviewed and considered, and addended by me as needed.   Signed,   Meredith Staggers, MD [facility].  01/06/16 5:09 PM

## 2016-01-05 NOTE — Patient Instructions (Addendum)
I anticipate your blood work will be okay, and will likely be able to fax your preoperative clearance form to [facility] in next few days. I do not anticipate a problem with surgery on the 18th at this time. Let me know if you have any questions.   IF you received an x-ray today, you will receive an invoice from [facility]. Please contact [facility] at [phone] with questions or concerns regarding your invoice.   IF you received labwork today, you will receive an invoice from [facility]. Please contact [facility] at [phone] with questions or concerns regarding your invoice.   Our billing staff will not be able to assist you with questions regarding bills from these companies.  You will be contacted with the lab results as soon as they are available. The fastest way to get your results is to activate your My Chart account. Instructions are located on the last page of this paperwork. If you have not heard from us regarding the results in 2 weeks, please contact this office.

## 2016-01-06 LAB — BASIC METABOLIC PANEL
BUN / CREAT RATIO: 10 (ref 9–20)
BUN: 10 mg/dL (ref 6–24)
CO2: 27 mmol/L (ref 18–29)
CREATININE: 1 mg/dL (ref 0.76–1.27)
Calcium: 9.6 mg/dL (ref 8.7–10.2)
Chloride: 100 mmol/L (ref 96–106)
GFR calc Af Amer: 103 mL/min/{1.73_m2} (ref >59)
GFR, EST NON AFRICAN AMERICAN: 89 mL/min/{1.73_m2} (ref >59)
Glucose: 96 mg/dL (ref 65–99)
Potassium: 4.7 mmol/L (ref 3.5–5.2)
SODIUM: 143 mmol/L (ref 134–144)

## 2016-01-06 LAB — CBC
HEMATOCRIT: 43.9 % (ref 37.5–51.0)
Hemoglobin: 15.4 g/dL (ref 13.0–17.7)
MCH: 29.7 pg (ref 26.6–33.0)
MCHC: 35.1 g/dL (ref 31.5–35.7)
MCV: 85 fL (ref 79–97)
Platelets: 214 10*3/uL (ref 150–379)
RBC: 5.19 x10E6/uL (ref 4.14–5.80)
RDW: 14.4 % (ref 12.3–15.4)
WBC: 9.5 10*3/uL (ref 3.4–10.8)

## 2019-08-16 ENCOUNTER — Encounter (HOSPITAL_BASED_OUTPATIENT_CLINIC_OR_DEPARTMENT_OTHER): Payer: Self-pay

## 2019-08-16 ENCOUNTER — Other Ambulatory Visit: Payer: Self-pay

## 2019-08-16 ENCOUNTER — Emergency Department (HOSPITAL_BASED_OUTPATIENT_CLINIC_OR_DEPARTMENT_OTHER)
Admission: EM | Admit: 2019-08-16 | Discharge: 2019-08-16 | Disposition: A | Discharge status: Home / Self Care | Payer: Worker's Compensation | Attending: Emergency Medicine | Admitting: Emergency Medicine

## 2019-08-16 ENCOUNTER — Emergency Department (HOSPITAL_BASED_OUTPATIENT_CLINIC_OR_DEPARTMENT_OTHER): Payer: Worker's Compensation

## 2019-08-16 DIAGNOSIS — W230XXA Caught, crushed, jammed, or pinched between moving objects, initial encounter: Secondary | ICD-10-CM | POA: Diagnosis not present

## 2019-08-16 DIAGNOSIS — S6722XA Crushing injury of left hand, initial encounter: Secondary | ICD-10-CM | POA: Diagnosis not present

## 2019-08-16 DIAGNOSIS — Y939 Activity, unspecified: Secondary | ICD-10-CM | POA: Diagnosis not present

## 2019-08-16 DIAGNOSIS — Y9269 Other specified industrial and construction area as the place of occurrence of the external cause: Secondary | ICD-10-CM | POA: Insufficient documentation

## 2019-08-16 DIAGNOSIS — Y999 Unspecified external cause status: Secondary | ICD-10-CM | POA: Insufficient documentation

## 2019-08-16 DIAGNOSIS — Z5321 Procedure and treatment not carried out due to patient leaving prior to being seen by health care provider: Secondary | ICD-10-CM | POA: Insufficient documentation

## 2019-08-16 HISTORY — DX: Type 2 diabetes mellitus without complications: E11.9

## 2019-08-16 NOTE — ED Triage Notes (Signed)
Pt presents with crush injury to the L hand from an industrial machine. Pt was seen at [facility] and had XR done. Pt provided disc with images.

## 2019-08-17 ENCOUNTER — Ambulatory Visit: Payer: Self-pay

## 2020-10-01 ENCOUNTER — Other Ambulatory Visit: Payer: Self-pay | Admitting: Neurological Surgery

## 2020-10-01 DIAGNOSIS — M4726 Other spondylosis with radiculopathy, lumbar region: Secondary | ICD-10-CM

## 2020-11-16 ENCOUNTER — Ambulatory Visit
Admission: RE | Admit: 2020-11-16 | Discharge: 2020-11-16 | Disposition: A | Discharge status: Home / Self Care | Payer: BC Managed Care – PPO | Source: Ambulatory Visit | Attending: Neurological Surgery | Admitting: Neurological Surgery

## 2020-11-16 DIAGNOSIS — M4726 Other spondylosis with radiculopathy, lumbar region: Secondary | ICD-10-CM

## 2021-01-14 ENCOUNTER — Other Ambulatory Visit: Payer: Self-pay

## 2021-01-14 ENCOUNTER — Encounter (HOSPITAL_COMMUNITY): Payer: Self-pay | Admitting: Emergency Medicine

## 2021-01-14 ENCOUNTER — Emergency Department (HOSPITAL_COMMUNITY): Payer: BC Managed Care – PPO

## 2021-01-14 ENCOUNTER — Emergency Department (HOSPITAL_COMMUNITY)
Admission: EM | Admit: 2021-01-14 | Discharge: 2021-01-14 | Disposition: A | Discharge status: Home / Self Care | Payer: BC Managed Care – PPO | Attending: Emergency Medicine | Admitting: Emergency Medicine

## 2021-01-14 DIAGNOSIS — R072 Precordial pain: Secondary | ICD-10-CM | POA: Diagnosis not present

## 2021-01-14 DIAGNOSIS — K273 Acute peptic ulcer, site unspecified, without hemorrhage or perforation: Secondary | ICD-10-CM | POA: Diagnosis not present

## 2021-01-14 DIAGNOSIS — Z87891 Personal history of nicotine dependence: Secondary | ICD-10-CM | POA: Diagnosis not present

## 2021-01-14 DIAGNOSIS — R61 Generalized hyperhidrosis: Secondary | ICD-10-CM | POA: Diagnosis not present

## 2021-01-14 DIAGNOSIS — E119 Type 2 diabetes mellitus without complications: Secondary | ICD-10-CM | POA: Insufficient documentation

## 2021-01-14 DIAGNOSIS — K279 Peptic ulcer, site unspecified, unspecified as acute or chronic, without hemorrhage or perforation: Secondary | ICD-10-CM

## 2021-01-14 LAB — BASIC METABOLIC PANEL
Anion gap: 9 (ref 5–15)
BUN: 12 mg/dL (ref 6–20)
CO2: 23 mmol/L (ref 22–32)
Calcium: 8.6 mg/dL — ABNORMAL LOW (ref 8.9–10.3)
Chloride: 105 mmol/L (ref 98–111)
Creatinine, Ser: 0.96 mg/dL (ref 0.61–1.24)
GFR, Estimated: >60 mL/min (ref >60)
Glucose, Bld: 105 mg/dL — ABNORMAL HIGH (ref 70–99)
Potassium: 4.2 mmol/L (ref 3.5–5.1)
Sodium: 137 mmol/L (ref 135–145)

## 2021-01-14 LAB — CBC
HCT: 40.1 % (ref 39.0–52.0)
Hemoglobin: 13.6 g/dL (ref 13.0–17.0)
MCH: 29.1 pg (ref 26.0–34.0)
MCHC: 33.9 g/dL (ref 30.0–36.0)
MCV: 85.9 fL (ref 80.0–100.0)
Platelets: 206 10*3/uL (ref 150–400)
RBC: 4.67 MIL/uL (ref 4.22–5.81)
RDW: 13.5 % (ref 11.5–15.5)
WBC: 9 10*3/uL (ref 4.0–10.5)
nRBC: 0 % (ref 0.0–0.2)

## 2021-01-14 LAB — TROPONIN I (HIGH SENSITIVITY)
Troponin I (High Sensitivity): 3 ng/L (ref <18)
Troponin I (High Sensitivity): 4 ng/L (ref <18)

## 2021-01-14 MED ORDER — OMEPRAZOLE 20 MG PO CPDR: 20.0000 mg | DELAYED_RELEASE_CAPSULE | Freq: Two times a day (BID) | ORAL | 0 refills | Status: DC

## 2021-01-14 MED ORDER — ASPIRIN 81 MG PO CHEW
324.0000 mg | CHEWABLE_TABLET | Freq: Once | ORAL | Status: AC
  Administered 2021-01-14: 13:00:00 324 mg via ORAL
  Filled 2021-01-14: qty 4

## 2021-01-14 MED ORDER — NITROGLYCERIN 0.4 MG SL SUBL
0.4000 mg | SUBLINGUAL_TABLET | SUBLINGUAL | Status: DC | PRN
  Administered 2021-01-14: 16:00:00 0.4 mg via SUBLINGUAL
  Filled 2021-01-14: qty 1

## 2021-01-14 MED ORDER — SUCRALFATE 1 G PO TABS: 1.0000 g | ORAL_TABLET | Freq: Three times a day (TID) | ORAL | 0 refills | Status: DC

## 2021-01-14 NOTE — ED Provider Notes (Signed)
Emergency Medicine Provider Triage Evaluation Note  Tim Richardson , a 50 y.o. male  was evaluated in triage.  Pt complains of pain.  He states that for 1 week he has had intermittent, sharp, left-sided chest pain.  He states that this has been worsening particularly since yesterday and today.  He endorses pain that goes down his left arm.  Endorses stabbing pain in his left hand.  Endorses pain that goes up to his right jaw.  Endorses pain in his left scapula.  He denies palpitations, shortness of breath, diaphoresis or nausea.  Nothing makes it better or worse.   Review of Systems  Positive: See above Negative:   Physical Exam  BP 139/83    Pulse 93    Temp 98.5 F (36.9 C) (Oral)    Resp 18    Ht 6\' 3"  (1.905 m)    Wt 121.6 kg    SpO2 98%    BMI 33.50 kg/m  Gen:   Awake, no distress   Resp:  Normal effort, lungs clear to auscultation MSK:   Moves extremities without difficulty  Other:  S1/S2 without murmur.  Pulses 2+ in bilateral upper extremities. Medical Decision Making  Medically screening exam initiated at 12:45 PM.  Appropriate orders placed.  was informed that the remainder of the evaluation will be completed by another provider, this initial triage assessment does not replace that evaluation, and the importance of remaining in the ED until their evaluation is complete.  Initiating chest pain work-up, given 324 of aspirin.   Haig Prophet, PA-C 01/14/21 1247    01/16/21, MD 01/15/21 [phone]

## 2021-01-14 NOTE — Discharge Instructions (Addendum)
We saw you in the ER for the chest pain/shortness of breath. All of our cardiac workup is normal, including labs, EKG and chest X-RAY are normal. We are not sure what is causing your discomfort, but we feel comfortable sending you home at this time.   - Cardiology team should call you for coronary CT appointment tomorrow. Id they don't, please call the number provided.  Please return to the ER if you have worsening chest pain, shortness of breath, pain radiating to your jaw, shoulder, or back, sweats or fainting. Otherwise see the Cardiologist or your primary care doctor as requested.

## 2021-01-14 NOTE — ED Triage Notes (Signed)
Patient arrives ambulatory c/o left sided chest pain, intermittent jaw pain and stabbing sensation in his left hand. States pain has been intermittent over the past week but has become more constant since yesterday afternoon.

## 2021-01-14 NOTE — ED Provider Notes (Signed)
[facility]-EMERGENCY DEPT Provider Note   CSN: [id number] Arrival date & time: 01/14/21  1233     History Chief Complaint  Patient presents with   Chest Pain    Tim Richardson is a 50 y.o. male.  HPI  HPI: A 50 year old patient with a history of hypertension and hypercholesterolemia presents for evaluation of chest pain. Initial onset of pain was approximately 1-3 hours ago. The patient's chest pain is not worse with exertion. The patient reports some diaphoresis. The patient's chest pain is middle- or left-sided, is not well-localized, is not described as heaviness/pressure/tightness, is not sharp and does radiate to the arms/jaw/neck. The patient does not complain of nausea. The patient has a family history of coronary artery disease in a first-degree relative with onset less than age 49. The patient has no history of stroke, has no history of peripheral artery disease, has not smoked in the past 90 days, denies any history of treated diabetes and does not have an elevated BMI (>=30).   Chest pain for the last week.  Feels like GERD but localized over midsternal region and left side.  Patient also has had radiation to the jaw, left upper extremity.  Frequency is increasing.  No specific evoking, aggravating or relieving factors.  Patient does not have a history of GERD.   Past Medical History:  Diagnosis Date   Allergy    Arthritis    Diabetes mellitus without complication (HCC)    GERD (gastroesophageal reflux disease)    Ulcer     There are no problems to display for this patient.   Past Surgical History:  Procedure Laterality Date   HERNIA REPAIR     SHOULDER SURGERY         Family History  Problem Relation Age of Onset   Diabetes Mother    Heart disease Mother    Stroke Mother    Heart disease Father    Hyperlipidemia Father    Heart disease Sister    Mental retardation Sister     Social History   Tobacco Use   Smoking status:  Former   Smokeless tobacco: Current    Types: Snuff  Vaping Use   Vaping Use: Never used  Substance Use Topics   Alcohol use: No   Drug use: No    Home Medications Prior to Admission medications   Medication Sig Start Date End Date Taking? Authorizing Provider  omeprazole (PRILOSEC) 20 MG capsule Take 1 capsule (20 mg total) by mouth 2 (two) times daily before a meal. 01/14/21  Yes Bonifacio Pruden, MD  sucralfate (CARAFATE) 1 g tablet Take 1 tablet (1 g total) by mouth 4 (four) times daily -  with meals and at bedtime. 01/14/21  Yes Derwood Kaplan, MD  acetaminophen (TYLENOL) 500 MG tablet Take 500 mg by mouth every 6 (six) hours as needed for headache.    [provider]  cetirizine (ZYRTEC) 10 MG tablet Take 10 mg by mouth daily.    [provider]  ibuprofen (ADVIL,MOTRIN) 200 MG tablet Take 200 mg by mouth every 6 (six) hours as needed.    [provider]  pantoprazole (PROTONIX) 20 MG tablet Take 40 mg by mouth 2 (two) times daily. Reported on 04/14/2015    [provider]  Simethicone 125 MG CAPS Take 1-2 capsules by mouth every 6 (six) hours as needed (gas). Reported on 04/14/2015    [provider]    Allergies    Patient has  no known allergies.  Review of Systems   Review of Systems  Constitutional:  Positive for activity change.  Cardiovascular:  Positive for chest pain.  All other systems reviewed and are negative.  Physical Exam Updated Vital Signs BP 131/90 (BP Location: Left Arm)    Pulse 69    Temp 98.5 F (36.9 C) (Oral)    Resp 18    Ht 6\' 3"  (1.905 m)    Wt 121.6 kg    SpO2 99%    BMI 33.50 kg/m   Physical Exam Vitals and nursing note reviewed.  Constitutional:      Appearance: He is well-developed.  HENT:     Head: Atraumatic.  Cardiovascular:     Rate and Rhythm: Normal rate.  Pulmonary:     Effort: Pulmonary effort is normal.  Musculoskeletal:     Cervical back: Neck supple.  Skin:    General: Skin is  warm.  Neurological:     Mental Status: He is alert and oriented to person, place, and time.    ED Results / Procedures / Treatments   Labs (all labs ordered are listed, but only abnormal results are displayed) Labs Reviewed  BASIC METABOLIC PANEL - Abnormal; Notable for the following components:      Result Value   Glucose, Bld 105 (*)    Calcium 8.6 (*)    All other components within normal limits  CBC  CBG MONITORING, ED  TROPONIN I (HIGH SENSITIVITY)  TROPONIN I (HIGH SENSITIVITY)    EKG EKG Interpretation  Date/Time:  Thursday January 14 2021 12:43:55 EST Ventricular Rate:  83 PR Interval:  168 QRS Duration: 102 QT Interval:  370 QTC Calculation: 435 R Axis:   83 Text Interpretation: Sinus rhythm Abnormal R-wave progression, early transition Minimal ST elevation, inferior leads No acute changes Confirmed by Varney Biles [phone]) on 01/14/2021 12:50:07 PM   .Arnette Felts  Radiology DG Chest Portable 1 View  Result Date: 01/14/2021 CLINICAL DATA:  A 50 year old male presents for evaluation of LEFT-sided chest pain and intermittent jaw pain. EXAM: PORTABLE CHEST 1 VIEW COMPARISON:  January 05, 2016. FINDINGS: Cardiomediastinal contours and hilar structures are normal. Lungs are clear. No gross effusion. No visible pneumothorax. On limited assessment there is no acute skeletal process. IMPRESSION: No acute cardiopulmonary disease. Electronically Signed   By: Zetta Bills M.D.   On: 01/14/2021 13:05    Procedures Procedures   Medications Ordered in ED Medications  nitroGLYCERIN (NITROSTAT) SL tablet 0.4 mg (0.4 mg Sublingual Given 01/14/21 1543)  aspirin chewable tablet 324 mg (324 mg Oral Given 01/14/21 1247)    ED Course  I have reviewed the triage vital signs and the nursing notes.  Pertinent labs & imaging results that were available during my care of the patient were reviewed by me and considered in my medical decision making (see chart for  details).  Clinical Course as of 01/14/21 1641  Thu Jan 14, 2021  1640 Nitro did not alleviate the pain. Although hear score is 5, troponin x2 are negative and patient's chest pain is having both atypical and typical features.  My pretest probability for ACS is lower.  Patient is reliable.  We have agreed to have the patient follow-up with cardiology service for coronary CT.  Have contacted cardiology team to get patient an appointment.  Strict ER return precautions also discussed.  We will start him on omeprazole twice daily and Carafate. [AN]    Clinical Course User Index [AN] Varney Biles, MD  MDM Rules/Calculators/A&P HEAR Score: 5                        Differential diagnosis includes: ACS syndrome Aortic dissection CHF exacerbation Valvular disorder Myocarditis Pericarditis Endocarditis Pericardial effusion / tamponade Pneumonia Pleural effusion / Pulmonary edema PE Pneumothorax Musculoskeletal pain PUD / Gastritis / Esophagitis Esophageal spasm  Pt comes in with cc of chest pain. High HEAR score.  Some typical features with the chest pain, but the primary concerns are for ACS vs. GERD.     Final Clinical Impression(s) / ED Diagnoses Final diagnoses:  Precordial chest pain  PUD (peptic ulcer disease)    Rx / DC Orders ED Discharge Orders          Ordered    omeprazole (PRILOSEC) 20 MG capsule  2 times daily before meals        01/14/21 1629    sucralfate (CARAFATE) 1 g tablet  3 times daily with meals & bedtime        01/14/21 1629             Varney Biles, MD 01/14/21 1641

## 2021-01-20 ENCOUNTER — Encounter: Payer: Self-pay | Admitting: Internal Medicine

## 2021-01-20 ENCOUNTER — Ambulatory Visit: Payer: BC Managed Care – PPO | Admitting: Internal Medicine

## 2021-01-20 ENCOUNTER — Other Ambulatory Visit: Payer: Self-pay

## 2021-01-20 VITALS — BP 134/81 | HR 83 | Ht 75.0 in | Wt 267.8 lb

## 2021-01-20 DIAGNOSIS — R079 Chest pain, unspecified: Secondary | ICD-10-CM

## 2021-01-20 DIAGNOSIS — E119 Type 2 diabetes mellitus without complications: Secondary | ICD-10-CM

## 2021-01-20 DIAGNOSIS — K219 Gastro-esophageal reflux disease without esophagitis: Secondary | ICD-10-CM

## 2021-01-20 LAB — BASIC METABOLIC PANEL
BUN/Creatinine Ratio: 13 (ref 9–20)
BUN: 14 mg/dL (ref 6–24)
CO2: 24 mmol/L (ref 20–29)
Calcium: 9.8 mg/dL (ref 8.7–10.2)
Chloride: 96 mmol/L (ref 96–106)
Creatinine, Ser: 1.04 mg/dL (ref 0.76–1.27)
Glucose: 136 mg/dL — ABNORMAL HIGH (ref 70–99)
Potassium: 4.7 mmol/L (ref 3.5–5.2)
Sodium: 138 mmol/L (ref 134–144)
eGFR: 86 mL/min/{1.73_m2} (ref >59)

## 2021-01-20 MED ORDER — METOPROLOL TARTRATE 100 MG PO TABS: ORAL_TABLET | ORAL | 0 refills | Status: DC

## 2021-01-20 NOTE — Patient Instructions (Addendum)
Medication Instructions:  Take Metoprolol 100 mg two hours before CT when scheduled.   *If you need a refill on your cardiac medications before your next appointment, please call your pharmacy*   Lab Work: BMET today   If you have labs (blood work) drawn today and your tests are completely normal, you will receive your results only by: MyChart Message (if you have MyChart) OR A paper copy in the mail If you have any lab test that is abnormal or we need to change your treatment, we will call you to review the results.   Testing/Procedures: Your physician has requested that you have cardiac CT. Cardiac computed tomography (CT) is a painless test that uses an x-ray machine to take clear, detailed pictures of your heart. For further information please visit [email or web address] Please follow instruction sheet as given.     Follow-Up: At [facility], you and your health needs are our priority.  As part of our continuing mission to provide you with exceptional heart care, we have created designated Provider Care Teams.  These Care Teams include your primary Cardiologist (physician) and Advanced Practice Providers (APPs -  Physician Assistants and Nurse Practitioners) who all work together to provide you with the care you need, when you need it.  We recommend signing up for the patient portal called "MyChart".  Sign up information is provided on this After Visit Summary.  MyChart is used to connect with patients for Virtual Visits (Telemedicine).  Patients are able to view lab/test results, encounter notes, upcoming appointments, etc.  Non-urgent messages can be sent to your provider as well.   To learn more about what you can do with MyChart, go to [email or web address].    Your next appointment:   Follow up after testing-   The format for your next appointment:   In Person  Provider:   Dr.Hilty     Other Instructions   Your cardiac CT will be scheduled at one of the below  locations:   [facility] [address] [place], [place] [address] [phone]   If scheduled at [facility], please arrive at the [facility] main entrance (entrance A) of [facility] 30 minutes prior to test start time. You can use the FREE valet parking offered at the main entrance (encouraged to control the heart rate for the test) Proceed to the [facility] Radiology Department (first floor) to check-in and test prep.   Please follow these instructions carefully (unless otherwise directed):  Hold all erectile dysfunction medications at least 3 days (72 hrs) prior to test.  On the Night Before the Test: Be sure to Drink plenty of water. Do not consume any caffeinated/decaffeinated beverages or chocolate 12 hours prior to your test. Do not take any antihistamines 12 hours prior to your test.  On the Day of the Test: Drink plenty of water until 1 hour prior to the test. Do not eat any food 4 hours prior to the test. You may take your regular medications prior to the test.  Take metoprolol (Lopressor) two hours prior to test. HOLD Furosemide/Hydrochlorothiazide morning of the test.  After the Test: Drink plenty of water. After receiving IV contrast, you may experience a mild flushed feeling. This is normal. On occasion, you may experience a mild rash up to 24 hours after the test. This is not dangerous. If this occurs, you can take Benadryl 25 mg and increase your fluid intake. If you experience trouble breathing, this can be serious.  If it is severe call 911 IMMEDIATELY. If it is mild, please call our office. If you take any of these medications: Glipizide/Metformin, Avandament, Glucavance, please do not take 48 hours after completing test unless otherwise instructed.  Please allow 2-4 weeks for scheduling of routine cardiac CTs. Some insurance companies require a pre-authorization which may delay scheduling of this test.   For non-scheduling  related questions, please contact the cardiac imaging nurse navigator should you have any questions/concerns: Rockwell Alexandria, Cardiac Imaging Nurse Navigator Larey Brick, Cardiac Imaging Nurse Navigator [facility] Direct Office Dial: [phone]   For scheduling needs, including cancellations and rescheduling, please call [place], [phone].

## 2021-01-20 NOTE — Progress Notes (Signed)
OFFICE CONSULT NOTE  Chief Complaint:  Chest pain  Primary Care Physician: Marva Panda, NP  HPI:  Tim Richardson is a 50 y.o. male who is being seen today for the evaluation of chest pain at the request of Marva Panda, NP.  This is a pleasant 50 year old male kindly referred for evaluation management of chest pain.  He was recently seen in the emergency department on December 22 for left jaw pain.  He had also been having some left arm achiness and chest discomfort.  His symptoms seem to be a little worse after eating.  He was given nitroglycerin without any prompt relief.  He was treated with Carafate that improved his symptoms somewhat and was started on Prilosec 20 mg twice daily and advised to get Carafate however the cost was prohibitive and is not currently taking that.  He has noted some improvement in his symptoms however ate a spicy meal yesterday and he said his symptoms return.  This is highly suggestive that reflux may be more of the issue.  He notes though that his jaw pain and left arm pain have resolved.  He is a former smoker having quit in his 30s.  He has a family history of heart disease including his father who had an MI at age 36 and heart disease and diabetes on his mother side as well.  PMHx:  Past Medical History:  Diagnosis Date   Allergy    Arthritis    Diabetes mellitus without complication (HCC)    GERD (gastroesophageal reflux disease)    Ulcer     Past Surgical History:  Procedure Laterality Date   HERNIA REPAIR     SHOULDER SURGERY      FAMHx:  Family History  Problem Relation Age of Onset   Diabetes Mother    Heart disease Mother    Stroke Mother    Heart disease Father    Hyperlipidemia Father    Heart disease Sister    Mental retardation Sister     SOCHx:   reports that he has quit smoking. His smokeless tobacco use includes snuff. He reports that he does not drink alcohol and does not use drugs.  ALLERGIES:  No Known  Allergies  ROS: Pertinent items noted in HPI and remainder of comprehensive ROS otherwise negative.  HOME MEDS: Current Outpatient Medications on File Prior to Visit  Medication Sig Dispense Refill   acetaminophen (TYLENOL) 500 MG tablet Take 500 mg by mouth every 6 (six) hours as needed for headache.     cetirizine (ZYRTEC) 10 MG tablet Take 10 mg by mouth daily.     ibuprofen (ADVIL,MOTRIN) 200 MG tablet Take 200 mg by mouth every 6 (six) hours as needed.     metFORMIN (GLUCOPHAGE) 500 MG tablet Take 500 mg by mouth 2 (two) times daily.     omeprazole (PRILOSEC) 20 MG capsule Take 1 capsule (20 mg total) by mouth 2 (two) times daily before a meal. 30 capsule 0   rosuvastatin (CRESTOR) 10 MG tablet rosuvastatin 10 mg tablet  TAKE 1 TABLET BY MOUTH ONCE DAILY FOR 90 DAYS     Simethicone 125 MG CAPS Take 1-2 capsules by mouth every 6 (six) hours as needed (gas). Reported on 04/14/2015     sucralfate (CARAFATE) 1 g tablet Take 1 tablet (1 g total) by mouth 4 (four) times daily -  with meals and at bedtime. 60 tablet 0   No current facility-administered medications on file prior to visit.  LABS/IMAGING: No results found for this or any previous visit (from the past 48 hour(s)). No results found.  LIPID PANEL: No results found for: CHOL, TRIG, HDL, CHOLHDL, VLDL, LDLCALC, LDLDIRECT  WEIGHTS: Wt Readings from Last 3 Encounters:  01/20/21 267 lb 12.8 oz (121.5 kg)  01/14/21 268 lb (121.6 kg)  08/16/19 (!) 269 lb (122 kg)    VITALS: BP 134/81    Pulse 83    Ht 6\' 3"  (1.905 m)    Wt 267 lb 12.8 oz (121.5 kg)    SpO2 99%    BMI 33.47 kg/m   EXAM: General appearance: alert and no distress Neck: no carotid bruit, no JVD, and thyroid not enlarged, symmetric, no tenderness/mass/nodules Lungs: clear to auscultation bilaterally Heart: regular rate and rhythm, S1, S2 normal, no murmur, click, rub or gallop Abdomen: soft, non-tender; bowel sounds normal; no masses,  no  organomegaly Extremities: extremities normal, atraumatic, no cyanosis or edema Pulses: 2+ and symmetric Skin: Skin color, texture, turgor normal. No rashes or lesions Neurologic: Grossly normal Psych: Pleasant  EKG: Deferred  ASSESSMENT: Chest pain Family history of premature coronary disease Probable GERD Type 2 diabetes  PLAN: 1.   Mr. Laurel has had a recent episode of chest pain and associated left arm pain which really started more with left jaw pain.  Ultimately seem to improve more with treatment of reflux symptoms however there is a strong family history of early onset heart disease.  For that, would recommend coronary CT angiography.  He will need likely 100 mg metoprolol given his heart rate in the 80s to improve the sensitivity of this test.  I would continue his twice daily Prilosec.  Follow-up with his primary care provider on this.  We will contact him in follow-up with results of his CT coronary angiogram and further adjust medications as necessary.  Shea Evans, MD, [facility], FACP  [place]   [facility]  Medical Director of the Advanced Lipid Disorders &  Cardiovascular Risk Reduction Clinic Diplomate of the American Board of Clinical Lipidology Attending Cardiologist  Direct Dial: [phone]   Fax: [phone]  Website:  [email or web address] [place] Uzzle 01/20/2021, 11:24 AM

## 2021-01-29 ENCOUNTER — Telehealth (HOSPITAL_COMMUNITY): Payer: Self-pay | Admitting: *Deleted

## 2021-01-29 NOTE — Telephone Encounter (Signed)
Reaching out to patient to offer assistance regarding upcoming cardiac imaging study; pt verbalizes understanding of appt date/time, parking situation and where to check in, pre-test NPO status and medications ordered, and verified current allergies; name and call back number provided for further questions should they arise ° °Tim Mohamad RN Navigator Cardiac Imaging °[facility] °[phone] office °[phone] cell ° °Patient to take 100mg metoprolol tartrate two hours prior to cardiac CT scan. He is aware to arrive at 2:45pm for his 3:15pm scan. °

## 2021-02-01 ENCOUNTER — Encounter (HOSPITAL_COMMUNITY): Payer: Self-pay

## 2021-02-01 ENCOUNTER — Other Ambulatory Visit: Payer: Self-pay

## 2021-02-01 ENCOUNTER — Ambulatory Visit (HOSPITAL_COMMUNITY)
Admission: RE | Admit: 2021-02-01 | Discharge: 2021-02-01 | Disposition: A | Discharge status: Home / Self Care | Payer: BC Managed Care – PPO | Source: Ambulatory Visit | Attending: Internal Medicine | Admitting: Internal Medicine

## 2021-02-01 DIAGNOSIS — R079 Chest pain, unspecified: Secondary | ICD-10-CM | POA: Diagnosis not present

## 2021-02-01 DIAGNOSIS — I2510 Atherosclerotic heart disease of native coronary artery without angina pectoris: Secondary | ICD-10-CM | POA: Diagnosis not present

## 2021-02-01 MED ORDER — NITROGLYCERIN 0.4 MG SL SUBL
0.8000 mg | SUBLINGUAL_TABLET | Freq: Once | SUBLINGUAL | Status: AC
  Administered 2021-02-01: 0.8 mg via SUBLINGUAL

## 2021-02-01 MED ORDER — NITROGLYCERIN 0.4 MG SL SUBL
SUBLINGUAL_TABLET | SUBLINGUAL | Status: AC
  Filled 2021-02-01: qty 2

## 2021-02-01 MED ORDER — IOHEXOL 350 MG/ML SOLN
95.0000 mL | Freq: Once | INTRAVENOUS | Status: AC | PRN
  Administered 2021-02-01: 95 mL via INTRAVENOUS

## 2021-02-03 ENCOUNTER — Telehealth: Payer: Self-pay | Admitting: Internal Medicine

## 2021-02-03 NOTE — Telephone Encounter (Signed)
Spoke with patient regarding CCTA results. Informed patient that when Dr. Rennis Golden reads the results and makes his notes, the patient will be contacted. He voiced understanding.

## 2021-02-03 NOTE — Telephone Encounter (Signed)
Pt is f/u on the CT results from 02/01/21

## 2021-02-08 ENCOUNTER — Ambulatory Visit: Payer: BC Managed Care – PPO | Admitting: Internal Medicine

## 2021-02-11 ENCOUNTER — Other Ambulatory Visit: Payer: Self-pay | Admitting: Internal Medicine

## 2021-02-11 ENCOUNTER — Ambulatory Visit: Payer: BC Managed Care – PPO | Admitting: Internal Medicine

## 2021-02-11 ENCOUNTER — Other Ambulatory Visit: Payer: Self-pay

## 2021-02-11 ENCOUNTER — Encounter: Payer: Self-pay | Admitting: Internal Medicine

## 2021-02-11 VITALS — BP 130/73 | HR 80 | Ht 75.0 in | Wt 260.4 lb

## 2021-02-11 DIAGNOSIS — R931 Abnormal findings on diagnostic imaging of heart and coronary circulation: Secondary | ICD-10-CM

## 2021-02-11 DIAGNOSIS — I200 Unstable angina: Secondary | ICD-10-CM

## 2021-02-11 DIAGNOSIS — R079 Chest pain, unspecified: Secondary | ICD-10-CM | POA: Diagnosis not present

## 2021-02-11 LAB — CBC
Hematocrit: 40.4 % (ref 37.5–51.0)
Hemoglobin: 14 g/dL (ref 13.0–17.7)
MCH: 28.7 pg (ref 26.6–33.0)
MCHC: 34.7 g/dL (ref 31.5–35.7)
MCV: 83 fL (ref 79–97)
Platelets: 223 10*3/uL (ref 150–450)
RBC: 4.87 x10E6/uL (ref 4.14–5.80)
RDW: 15.1 % (ref 11.6–15.4)
WBC: 6.7 10*3/uL (ref 3.4–10.8)

## 2021-02-11 LAB — BASIC METABOLIC PANEL
BUN/Creatinine Ratio: 11 (ref 9–20)
BUN: 12 mg/dL (ref 6–24)
CO2: 28 mmol/L (ref 20–29)
Calcium: 9.6 mg/dL (ref 8.7–10.2)
Chloride: 101 mmol/L (ref 96–106)
Creatinine, Ser: 1.08 mg/dL (ref 0.76–1.27)
Glucose: 120 mg/dL — ABNORMAL HIGH (ref 70–99)
Potassium: 5 mmol/L (ref 3.5–5.2)
Sodium: 137 mmol/L (ref 134–144)
eGFR: 83 mL/min/{1.73_m2} (ref >59)

## 2021-02-11 NOTE — Patient Instructions (Addendum)
Medication Instructions:   START aspirin 81mg  daily  *If you need a refill on your cardiac medications before your next appointment, please call your pharmacy*   Lab Work: BMET & CBC today   If you have labs (blood work) drawn today and your tests are completely normal, you will receive your results only by: MyChart Message (if you have MyChart) OR A paper copy in the mail If you have any lab test that is abnormal or we need to change your treatment, we will call you to review the results.   Testing/Procedures: Left Heart Cath @ [facility] tomorrow 02/12/21 with Dr. 02/14/21    Follow-Up: At [facility], you and your health needs are our priority.  As part of our continuing mission to provide you with exceptional heart care, we have created designated Provider Care Teams.  These Care Teams include your primary Cardiologist (physician) and Advanced Practice Providers (APPs -  Physician Assistants and Nurse Practitioners) who all work together to provide you with the care you need, when you need it.  We recommend signing up for the patient portal called "MyChart".  Sign up information is provided on this After Visit Summary.  MyChart is used to connect with patients for Virtual Visits (Telemedicine).  Patients are able to view lab/test results, encounter notes, upcoming appointments, etc.  Non-urgent messages can be sent to your provider as well.   To learn more about what you can do with MyChart, go to [facility].    Your next appointment:   3-4 weeks after procedure Schedule with Dr. [email or web address] or PA/NP  Other Instructions  Tim Richardson MEDICAL [facility] CARDIOVASCULAR DIVISION [facility] [address] [place] Dept: [phone] Loc: [phone]  Tim Richardson  02/11/2021  You are scheduled for a Cardiac Catheterization on Friday, January 20 with Dr. January 22.  1. Please arrive at the [facility] (Main Entrance  A) at [facility]: [address] [place], [place] at 11:30 AM (This time is two hours before your procedure to ensure your preparation). Free valet parking service is available.   Special note: Every effort is made to have your procedure done on time. Please understand that emergencies sometimes delay scheduled procedures.  2. Diet: Do not eat solid foods after midnight.  The patient may have clear liquids until 5am upon the day of the procedure.  3. Labs: CBC & BMET today  4. Medication instructions in preparation for your procedure:  Do not take Diabetes Med Glucophage (Metformin) on the day of the procedure and HOLD 48 HOURS AFTER THE PROCEDURE.  On the morning of your procedure, take Aspirin and any morning medicines NOT listed above.  You may use sips of water.  5. Plan for one night stay--bring personal belongings. 6. Bring a current list of your medications and current insurance cards. 7. You MUST have a responsible person to drive you home. 8. Someone MUST be with you the first 24 hours after you arrive home or your discharge will be delayed. 9. Please wear clothes that are easy to get on and off and wear slip-on shoes.  Thank you for allowing [phone] to care for you!   -- [place] [facility]

## 2021-02-11 NOTE — H&P (View-Only) (Signed)
OFFICE CONSULT NOTE  Chief Complaint:  Chest pain  Primary Care Physician: Tim Beals, NP  HPI:  Tim Richardson is a 51 y.o. male who is being seen today for the evaluation of chest pain at the request of Tim Beals, NP.  This is a pleasant 51 year old male kindly referred for evaluation management of chest pain.  He was recently seen in the emergency department on December 22 for left jaw pain.  He had also been having some left arm achiness and chest discomfort.  His symptoms seem to be a little worse after eating.  He was given nitroglycerin without any prompt relief.  He was treated with Carafate that improved his symptoms somewhat and was started on Prilosec 20 mg twice daily and advised to get Carafate however the cost was prohibitive and is not currently taking that.  He has noted some improvement in his symptoms however ate a spicy meal yesterday and he said his symptoms return.  This is highly suggestive that reflux may be more of the issue.  He notes though that his jaw pain and left arm pain have resolved.  He is a former smoker having quit in his 52s.  He has a family history of heart disease including his father who had an MI at age 24 and heart disease and diabetes on his mother side as well.  02/11/2021  Tim Richardson returns today for follow-up.  He was having symptoms concerning for angina but underwent CT coronary angiography.  Demonstrated calcium score 574, 98th percentile for age and sex matched controls.  The right coronary was dominant showing a proximal mixed plaque which was severe in the RCA.  There was mild plaque in the mid LAD.  The study was not sufficient to send to [facility] and cardiac catheterization was recommended.  I discussed that with him today in follow-up and I agree that we should proceed for a cardiac catheterization based on the significant finding in the right coronary and the fact that his symptoms were more typical for angina.  I would advise  starting aspirin 81 mg daily.  PMHx:  Past Medical History:  Diagnosis Date   Allergy    Arthritis    Diabetes mellitus without complication (HCC)    GERD (gastroesophageal reflux disease)    Ulcer     Past Surgical History:  Procedure Laterality Date   HERNIA REPAIR     SHOULDER SURGERY      FAMHx:  Family History  Problem Relation Age of Onset   Diabetes Mother    Heart disease Mother    Stroke Mother    Heart disease Father    Hyperlipidemia Father    Heart disease Sister    Mental retardation Sister     SOCHx:   reports that he has quit smoking. His smokeless tobacco use includes snuff. He reports that he does not drink alcohol and does not use drugs.  ALLERGIES:  No Known Allergies  ROS: Pertinent items noted in HPI and remainder of comprehensive ROS otherwise negative.  HOME MEDS: Current Outpatient Medications on File Prior to Visit  Medication Sig Dispense Refill   acetaminophen (TYLENOL) 500 MG tablet Take 500 mg by mouth every 6 (six) hours as needed for headache.     ibuprofen (ADVIL,MOTRIN) 200 MG tablet Take 200 mg by mouth every 6 (six) hours as needed.     metFORMIN (GLUCOPHAGE) 500 MG tablet Take 500 mg by mouth 2 (two) times daily.  omeprazole (PRILOSEC) 20 MG capsule Take 1 capsule (20 mg total) by mouth 2 (two) times daily before a meal. 30 capsule 0   rosuvastatin (CRESTOR) 10 MG tablet rosuvastatin 10 mg tablet  TAKE 1 TABLET BY MOUTH ONCE DAILY FOR 90 DAYS     Simethicone 125 MG CAPS Take 1-2 capsules by mouth every 6 (six) hours as needed (gas). Reported on 04/14/2015     No current facility-administered medications on file prior to visit.    LABS/IMAGING: No results found for this or any previous visit (from the past 48 hour(s)). No results found.  LIPID PANEL: No results found for: CHOL, TRIG, HDL, CHOLHDL, VLDL, LDLCALC, LDLDIRECT  WEIGHTS: Wt Readings from Last 3 Encounters:  02/11/21 260 lb 6.4 oz (118.1 kg)  01/20/21 267 lb  12.8 oz (121.5 kg)  01/14/21 268 lb (121.6 kg)    VITALS: BP 130/73    Pulse 80    Ht 6\' 3"  (1.905 m)    Wt 260 lb 6.4 oz (118.1 kg)    SpO2 98%    BMI 32.55 kg/m   EXAM: Deferred  EKG: Deferred  ASSESSMENT: Chest pain, concerning for unstable angina-abnormal coronary CT angiogram, CAC score 574, 98th percentile, severe mixed plaque in the proximal RCA and mild calcified plaque in the mid LAD, FFR could not be sent (01/2021) Family history of premature coronary disease Probable GERD Type 2 diabetes  PLAN: 1.   Tim Richardson had chest pain concerning for unstable angina however I had proceeded with CT coronary angiography.  This suggests a severe plaque in the proximal RCA likely causative of his symptoms.  I am recommending cardiac catheterization.  I discussed this further with him and his wife today.  We also went over the risk, benefits and alternatives of cardiac catheterization as well as the procedure in great detail and I answered multiple questions.  In total the visit was over 45 minutes.  He had no further questions and would like to schedule the procedure as soon as possible.  We were able to arrange it tomorrow.  Plan follow-up with me afterwards.  Pixie Casino, MD, [facility] [place] Director of the Advanced Lipid Disorders &  Cardiovascular Risk Reduction Clinic Diplomate of the American Board of Clinical Lipidology Attending Cardiologist  Direct Dial: [phone]   Fax: [phone]  Website:  [email or web address] 02/11/2021, 8:35 AM

## 2021-02-11 NOTE — Progress Notes (Signed)
° °OFFICE CONSULT NOTE ° °Chief Complaint:  °Chest pain ° °Primary Care Physician: °Millsaps, Kimberly, NP ° °HPI:  °Tim Richardson is a 50 y.o. male who is being seen today for the evaluation of chest pain at the request of Millsaps, Kimberly, NP.  This is a pleasant 50-year-old male kindly referred for evaluation management of chest pain.  He was recently seen in the emergency department on December 22 for left jaw pain.  He had also been having some left arm achiness and chest discomfort.  His symptoms seem to be a little worse after eating.  He was given nitroglycerin without any prompt relief.  He was treated with Carafate that improved his symptoms somewhat and was started on Prilosec 20 mg twice daily and advised to get Carafate however the cost was prohibitive and is not currently taking that.  He has noted some improvement in his symptoms however ate a spicy meal yesterday and he said his symptoms return.  This is highly suggestive that reflux may be more of the issue.  He notes though that his jaw pain and left arm pain have resolved.  He is a former smoker having quit in his 30s.  He has a family history of heart disease including his father who had an MI at age 56 and heart disease and diabetes on his mother side as well. ° °02/11/2021 ° °Tim Richardson returns today for follow-up.  He was having symptoms concerning for angina but underwent CT coronary angiography.  Demonstrated calcium score 574, 98th percentile for age and sex matched controls.  The right coronary was dominant showing a proximal mixed plaque which was severe in the RCA.  There was mild plaque in the mid LAD.  The study was not sufficient to send to [facility]FR and cardiac catheterization was recommended.  I discussed that with him today in follow-up and I agree that we should proceed for a cardiac catheterization based on the significant finding in the right coronary and the fact that his symptoms were more typical for angina.  I would advise  starting aspirin 81 mg daily. ° °PMHx:  °Past Medical History:  °Diagnosis Date  ° Allergy   ° Arthritis   ° Diabetes mellitus without complication (HCC)   ° GERD (gastroesophageal reflux disease)   ° Ulcer   ° ° °Past Surgical History:  °Procedure Laterality Date  ° HERNIA REPAIR    ° SHOULDER SURGERY    ° ° °FAMHx:  °Family History  °Problem Relation Age of Onset  ° Diabetes Mother   ° Heart disease Mother   ° Stroke Mother   ° Heart disease Father   ° Hyperlipidemia Father   ° Heart disease Sister   ° Mental retardation Sister   ° ° °SOCHx:  ° reports that he has quit smoking. His smokeless tobacco use includes snuff. He reports that he does not drink alcohol and does not use drugs. ° °ALLERGIES:  °No Known Allergies ° °ROS: °Pertinent items noted in HPI and remainder of comprehensive ROS otherwise negative. ° °HOME MEDS: °Current Outpatient Medications on File Prior to Visit  °Medication Sig Dispense Refill  ° acetaminophen (TYLENOL) 500 MG tablet Take 500 mg by mouth every 6 (six) hours as needed for headache.    ° ibuprofen (ADVIL,MOTRIN) 200 MG tablet Take 200 mg by mouth every 6 (six) hours as needed.    ° metFORMIN (GLUCOPHAGE) 500 MG tablet Take 500 mg by mouth 2 (two) times daily.    °   omeprazole (PRILOSEC) 20 MG capsule Take 1 capsule (20 mg total) by mouth 2 (two) times daily before a meal. 30 capsule 0  ° rosuvastatin (CRESTOR) 10 MG tablet rosuvastatin 10 mg tablet ° TAKE 1 TABLET BY MOUTH ONCE DAILY FOR 90 DAYS    ° Simethicone 125 MG CAPS Take 1-2 capsules by mouth every 6 (six) hours as needed (gas). Reported on 04/14/2015    ° °No current facility-administered medications on file prior to visit.  ° ° °LABS/IMAGING: °No results found for this or any previous visit (from the past 48 hour(s)). °No results found. ° °LIPID PANEL: °No results found for: CHOL, TRIG, HDL, CHOLHDL, VLDL, LDLCALC, LDLDIRECT ° °WEIGHTS: °Wt Readings from Last 3 Encounters:  °02/11/21 260 lb 6.4 oz (118.1 kg)  °01/20/21 267 lb  12.8 oz (121.5 kg)  °01/14/21 268 lb (121.6 kg)  ° ° °VITALS: °BP 130/73    Pulse 80    Ht 6' 3" (1.905 m)    Wt 260 lb 6.4 oz (118.1 kg)    SpO2 98%    BMI 32.55 kg/m²  ° °EXAM: °Deferred ° °EKG: °Deferred ° °ASSESSMENT: °Chest pain, concerning for unstable angina-abnormal coronary CT angiogram, CAC score 574, 98th percentile, severe mixed plaque in the proximal RCA and mild calcified plaque in the mid LAD, FFR could not be sent (01/2021) °Family history of premature coronary disease °Probable GERD °Type 2 diabetes ° °PLAN: °1.   Mr. Buer had chest pain concerning for unstable angina however I had proceeded with CT coronary angiography.  This suggests a severe plaque in the proximal RCA likely causative of his symptoms.  I am recommending cardiac catheterization.  I discussed this further with him and his wife today.  We also went over the risk, benefits and alternatives of cardiac catheterization as well as the procedure in great detail and I answered multiple questions.  In total the visit was over 45 minutes.  He had no further questions and would like to schedule the procedure as soon as possible.  We were able to arrange it tomorrow.  Plan follow-up with me afterwards. ° °Cherina Dhillon C. Diahann Guajardo, MD, FACC, FACP  °[place]   [facility]  °Medical Director of the Advanced Lipid Disorders &  °Cardiovascular Risk Reduction Clinic °Diplomate of the American Board of Clinical Lipidology °Attending Cardiologist  °Direct Dial: [phone]   Fax: [phone]  °Website:  [email or web address] ° °Tim Richardson °02/11/2021, 8:35 AM °

## 2021-02-12 ENCOUNTER — Encounter (HOSPITAL_COMMUNITY)
Admission: RE | Disposition: A | Discharge status: Home / Self Care | Payer: Self-pay | Source: Home / Self Care | Attending: Cardiology

## 2021-02-12 ENCOUNTER — Telehealth: Payer: Self-pay | Admitting: Internal Medicine

## 2021-02-12 ENCOUNTER — Other Ambulatory Visit: Payer: Self-pay

## 2021-02-12 ENCOUNTER — Ambulatory Visit (HOSPITAL_COMMUNITY)
Admission: RE | Admit: 2021-02-12 | Discharge: 2021-02-12 | Disposition: A | Discharge status: Home / Self Care | Payer: BC Managed Care – PPO | Attending: Cardiology | Admitting: Cardiology

## 2021-02-12 DIAGNOSIS — Z8249 Family history of ischemic heart disease and other diseases of the circulatory system: Secondary | ICD-10-CM | POA: Diagnosis not present

## 2021-02-12 DIAGNOSIS — R079 Chest pain, unspecified: Secondary | ICD-10-CM | POA: Insufficient documentation

## 2021-02-12 DIAGNOSIS — Z87891 Personal history of nicotine dependence: Secondary | ICD-10-CM | POA: Insufficient documentation

## 2021-02-12 DIAGNOSIS — I2510 Atherosclerotic heart disease of native coronary artery without angina pectoris: Secondary | ICD-10-CM | POA: Insufficient documentation

## 2021-02-12 DIAGNOSIS — I200 Unstable angina: Secondary | ICD-10-CM | POA: Diagnosis present

## 2021-02-12 DIAGNOSIS — E119 Type 2 diabetes mellitus without complications: Secondary | ICD-10-CM | POA: Insufficient documentation

## 2021-02-12 DIAGNOSIS — Z7984 Long term (current) use of oral hypoglycemic drugs: Secondary | ICD-10-CM | POA: Insufficient documentation

## 2021-02-12 DIAGNOSIS — R931 Abnormal findings on diagnostic imaging of heart and coronary circulation: Secondary | ICD-10-CM | POA: Diagnosis present

## 2021-02-12 HISTORY — PX: LEFT HEART CATH AND CORONARY ANGIOGRAPHY: CATH118249

## 2021-02-12 LAB — GLUCOSE, CAPILLARY
Glucose-Capillary: 116 mg/dL — ABNORMAL HIGH (ref 70–99)
Glucose-Capillary: 101 mg/dL — ABNORMAL HIGH (ref 70–99)

## 2021-02-12 SURGERY — LEFT HEART CATH AND CORONARY ANGIOGRAPHY
Anesthesia: LOCAL

## 2021-02-12 MED ORDER — FENTANYL CITRATE (PF) 100 MCG/2ML IJ SOLN
INTRAMUSCULAR | Status: AC
  Filled 2021-02-12: qty 2

## 2021-02-12 MED ORDER — VERAPAMIL HCL 2.5 MG/ML IV SOLN
INTRAVENOUS | Status: DC | PRN
  Administered 2021-02-12: 10 mL via INTRA_ARTERIAL

## 2021-02-12 MED ORDER — MIDAZOLAM HCL 2 MG/2ML IJ SOLN
INTRAMUSCULAR | Status: DC | PRN
  Administered 2021-02-12: 1 mg via INTRAVENOUS

## 2021-02-12 MED ORDER — LIDOCAINE HCL (PF) 1 % IJ SOLN
INTRAMUSCULAR | Status: DC | PRN
  Administered 2021-02-12: 2 mL

## 2021-02-12 MED ORDER — ASPIRIN 81 MG PO CHEW: 81.0000 mg | CHEWABLE_TABLET | ORAL | Status: DC

## 2021-02-12 MED ORDER — HEPARIN SODIUM (PORCINE) 1000 UNIT/ML IJ SOLN
INTRAMUSCULAR | Status: DC | PRN
  Administered 2021-02-12: 6000 [IU] via INTRAVENOUS

## 2021-02-12 MED ORDER — IOHEXOL 350 MG/ML SOLN
INTRAVENOUS | Status: DC | PRN
  Administered 2021-02-12: 60 mL

## 2021-02-12 MED ORDER — FENTANYL CITRATE (PF) 100 MCG/2ML IJ SOLN
INTRAMUSCULAR | Status: DC | PRN
  Administered 2021-02-12: 25 ug via INTRAVENOUS

## 2021-02-12 MED ORDER — SODIUM CHLORIDE 0.9 % WEIGHT BASED INFUSION
3.0000 mL/kg/h | INTRAVENOUS | Status: AC
  Administered 2021-02-12: 3 mL/kg/h via INTRAVENOUS

## 2021-02-12 MED ORDER — VERAPAMIL HCL 2.5 MG/ML IV SOLN
INTRAVENOUS | Status: AC
  Filled 2021-02-12: qty 2

## 2021-02-12 MED ORDER — HEPARIN SODIUM (PORCINE) 1000 UNIT/ML IJ SOLN
INTRAMUSCULAR | Status: AC
  Filled 2021-02-12: qty 10

## 2021-02-12 MED ORDER — SODIUM CHLORIDE 0.9% FLUSH: 3.0000 mL | INTRAVENOUS | Status: DC | PRN

## 2021-02-12 MED ORDER — SODIUM CHLORIDE 0.9 % WEIGHT BASED INFUSION: 1.0000 mL/kg/h | INTRAVENOUS | Status: DC

## 2021-02-12 MED ORDER — MIDAZOLAM HCL 2 MG/2ML IJ SOLN
INTRAMUSCULAR | Status: AC
  Filled 2021-02-12: qty 2

## 2021-02-12 MED ORDER — HEPARIN (PORCINE) IN NACL 1000-0.9 UT/500ML-% IV SOLN
INTRAVENOUS | Status: DC | PRN
  Administered 2021-02-12 (×2): 500 mL

## 2021-02-12 MED ORDER — LIDOCAINE HCL (PF) 1 % IJ SOLN
INTRAMUSCULAR | Status: AC
  Filled 2021-02-12: qty 30

## 2021-02-12 SURGICAL SUPPLY — 10 items
CATH OPTITORQUE TIG 4.0 5F (CATHETERS) ×1 IMPLANT
DEVICE RAD COMP TR BAND LRG (VASCULAR PRODUCTS) ×1 IMPLANT
GLIDESHEATH SLEND SS 6F .021 (SHEATH) ×1 IMPLANT
GUIDEWIRE INQWIRE 1.5J.035X260 (WIRE) IMPLANT
INQWIRE 1.5J .035X260CM (WIRE) ×2
KIT HEART LEFT (KITS) ×3 IMPLANT
PACK CARDIAC CATHETERIZATION (CUSTOM PROCEDURE TRAY) ×3 IMPLANT
SHEATH PROBE COVER 6X72 (BAG) ×1 IMPLANT
TRANSDUCER W/STOPCOCK (MISCELLANEOUS) ×3 IMPLANT
TUBING CIL FLEX 10 FLL-RA (TUBING) ×3 IMPLANT

## 2021-02-12 NOTE — Discharge Instructions (Signed)
NO METFORMIN FOR 2 DAYS 

## 2021-02-12 NOTE — Interval H&P Note (Signed)
History and Physical Interval Note:  02/12/2021 12:37 PM  Tim Richardson  has presented today for surgery, with the diagnosis of chest pain, abnormal cardiac ct.    After the most recent visit, CT scan was performed that showed severe RCA disease, not amenable for CT FFR.  Cardiac catheterization recommended.   The various methods of treatment have been discussed with the patient and family. After consideration of risks, benefits and other options for treatment, the patient has consented to  Procedure(s): LEFT HEART CATH AND CORONARY ANGIOGRAPHY (N/A) as a surgical intervention.  The patient's history has been reviewed, patient examined, no change in status, stable for surgery.  I have reviewed the patient's chart and labs.  Questions were answered to the patient's satisfaction.    Cath Lab Visit (complete for each Cath Lab visit)  Clinical Evaluation Leading to the Procedure:   ACS: No.  Non-ACS:    Anginal Classification: CCS III  Anti-ischemic medical therapy: Minimal Therapy (1 class of medications)  Non-Invasive Test Results: High-risk stress test findings: cardiac mortality >3%/year  Prior CABG: No previous CABG   Bryan Lemma

## 2021-02-12 NOTE — Telephone Encounter (Signed)
Patient notified that [employer] paperwork is ready to be picked up when able. It is at the front desk

## 2021-02-15 ENCOUNTER — Encounter (HOSPITAL_COMMUNITY): Payer: Self-pay | Admitting: Cardiology

## 2021-02-18 DIAGNOSIS — E785 Hyperlipidemia, unspecified: Secondary | ICD-10-CM | POA: Diagnosis not present

## 2021-02-18 DIAGNOSIS — R1013 Epigastric pain: Secondary | ICD-10-CM | POA: Diagnosis not present

## 2021-02-18 DIAGNOSIS — I10 Essential (primary) hypertension: Secondary | ICD-10-CM | POA: Diagnosis not present

## 2021-02-18 DIAGNOSIS — E119 Type 2 diabetes mellitus without complications: Secondary | ICD-10-CM | POA: Diagnosis not present

## 2021-02-18 DIAGNOSIS — Z191 Hormone sensitive malignancy status: Secondary | ICD-10-CM | POA: Diagnosis not present

## 2021-02-23 DIAGNOSIS — K219 Gastro-esophageal reflux disease without esophagitis: Secondary | ICD-10-CM | POA: Diagnosis not present

## 2021-02-23 DIAGNOSIS — R1013 Epigastric pain: Secondary | ICD-10-CM | POA: Diagnosis not present

## 2021-02-23 DIAGNOSIS — Z1211 Encounter for screening for malignant neoplasm of colon: Secondary | ICD-10-CM | POA: Diagnosis not present

## 2021-03-04 DIAGNOSIS — R1013 Epigastric pain: Secondary | ICD-10-CM | POA: Diagnosis not present

## 2021-03-05 ENCOUNTER — Ambulatory Visit: Payer: BC Managed Care – PPO | Admitting: Internal Medicine

## 2021-03-05 ENCOUNTER — Encounter: Payer: Self-pay | Admitting: Internal Medicine

## 2021-03-05 ENCOUNTER — Other Ambulatory Visit: Payer: Self-pay

## 2021-03-05 VITALS — BP 110/70 | HR 95 | Ht 75.0 in | Wt 252.2 lb

## 2021-03-05 DIAGNOSIS — E119 Type 2 diabetes mellitus without complications: Secondary | ICD-10-CM

## 2021-03-05 DIAGNOSIS — E785 Hyperlipidemia, unspecified: Secondary | ICD-10-CM

## 2021-03-05 DIAGNOSIS — I2510 Atherosclerotic heart disease of native coronary artery without angina pectoris: Secondary | ICD-10-CM

## 2021-03-05 DIAGNOSIS — K219 Gastro-esophageal reflux disease without esophagitis: Secondary | ICD-10-CM

## 2021-03-05 DIAGNOSIS — R079 Chest pain, unspecified: Secondary | ICD-10-CM | POA: Diagnosis not present

## 2021-03-05 MED ORDER — ROSUVASTATIN CALCIUM 20 MG PO TABS: 20.0000 mg | ORAL_TABLET | Freq: Every day | ORAL | 3 refills | Status: DC

## 2021-03-05 NOTE — Progress Notes (Signed)
OFFICE CONSULT NOTE  Chief Complaint:  Chest pain  Primary Care Physician: Marva Panda, NP  HPI:  Tim Richardson is a 51 y.o. male who is being seen today for the evaluation of chest pain at the request of Marva Panda, NP.  This is a pleasant 51 year old male kindly referred for evaluation management of chest pain.  He was recently seen in the emergency department on December 22 for left jaw pain.  He had also been having some left arm achiness and chest discomfort.  His symptoms seem to be a little worse after eating.  He was given nitroglycerin without any prompt relief.  He was treated with Carafate that improved his symptoms somewhat and was started on Prilosec 20 mg twice daily and advised to get Carafate however the cost was prohibitive and is not currently taking that.  He has noted some improvement in his symptoms however ate a spicy meal yesterday and he said his symptoms return.  This is highly suggestive that reflux may be more of the issue.  He notes though that his jaw pain and left arm pain have resolved.  He is a former smoker having quit in his 30s.  He has a family history of heart disease including his father who had an MI at age 72 and heart disease and diabetes on his mother side as well.  02/11/2021  Tim Richardson returns today for follow-up.  He was having symptoms concerning for angina but underwent CT coronary angiography.  Demonstrated calcium score 574, 98th percentile for age and sex matched controls.  The right coronary was dominant showing a proximal mixed plaque which was severe in the RCA.  There was mild plaque in the mid LAD.  The study was not sufficient to send to [facility] and cardiac catheterization was recommended.  I discussed that with him today in follow-up and I agree that we should proceed for a cardiac catheterization based on the significant finding in the right coronary and the fact that his symptoms were more typical for angina.  I would advise  starting aspirin 81 mg daily.  03/05/2021  Tim Richardson is seen today in follow-up.  Overall he says he is feeling better.  Right heart catheterization on February 12, 2021, for concerning symptoms and an abnormal coronary CT that showed a high calcium score and at least moderate coronary disease.  Cath did show moderate two-vessel coronary disease including 45% stenosis of the proximal RCA and 50% mid circumflex.  The LAD was demonstrating minimal coronary disease.  Medical therapy was recommended.  He has been on aspirin.  He had recent labs showing his LDL down to 66 however he is only on moderate dose rosuvastatin 10 mg daily.  He has also lost weight and is made dietary changes.  He says he recently started on Carafate with concern for possible ulcer.  He has upcoming endoscopy and colonoscopy to further evaluate other causes of his chest pain.  PMHx:  Past Medical History:  Diagnosis Date   Allergy    Arthritis    Diabetes mellitus without complication (HCC)    GERD (gastroesophageal reflux disease)    Ulcer     Past Surgical History:  Procedure Laterality Date   HERNIA REPAIR     LEFT HEART CATH AND CORONARY ANGIOGRAPHY N/A 02/12/2021   Procedure: LEFT HEART CATH AND CORONARY ANGIOGRAPHY;  Surgeon: Marykay Lex, MD;  Location: [facility] INVASIVE CV LAB;  Service: Cardiovascular;  Laterality: N/A;   SHOULDER SURGERY  FAMHx:  Family History  Problem Relation Age of Onset   Diabetes Mother    Heart disease Mother    Stroke Mother    Heart disease Father    Hyperlipidemia Father    Heart disease Sister    Mental retardation Sister     SOCHx:   reports that he has quit smoking. His smokeless tobacco use includes snuff. He reports that he does not drink alcohol and does not use drugs.  ALLERGIES:  No Known Allergies  ROS: Pertinent items noted in HPI and remainder of comprehensive ROS otherwise negative.  HOME MEDS: Current Outpatient Medications on File Prior to Visit   Medication Sig Dispense Refill   aspirin EC 81 MG tablet Take 81 mg by mouth daily. Swallow whole.     metFORMIN (GLUCOPHAGE) 500 MG tablet Take 500 mg by mouth 2 (two) times daily.     Omega-3 1000 MG CAPS Take 1,000 mg by mouth daily.     omeprazole (PRILOSEC) 20 MG capsule Take 1 capsule (20 mg total) by mouth 2 (two) times daily before a meal. (Patient taking differently: Take 40 mg by mouth daily.) 30 capsule 0   omeprazole (PRILOSEC) 40 MG capsule Take 40 mg by mouth daily.     rosuvastatin (CRESTOR) 10 MG tablet Take 10 mg by mouth daily.     simethicone (MYLICON) 80 MG chewable tablet Chew 80 mg by mouth every 6 (six) hours as needed for flatulence.     sucralfate (CARAFATE) 1 g tablet Take 1 g by mouth 4 (four) times daily.     Vitamin D3 (VITAMIN D) 25 MCG tablet Take 1,000 Units by mouth daily.     zinc gluconate 50 MG tablet Take 50 mg by mouth daily.     acetaminophen (TYLENOL) 500 MG tablet Take 1,000 mg by mouth every 6 (six) hours as needed for headache. (Patient not taking: Reported on 03/05/2021)     Garlic 1000 MG CAPS Take 1,000 mg by mouth daily. (Patient not taking: Reported on 03/05/2021)     ibuprofen (ADVIL,MOTRIN) 200 MG tablet Take 800 mg by mouth every 6 (six) hours as needed for mild pain or moderate pain. (Patient not taking: Reported on 03/05/2021)     No current facility-administered medications on file prior to visit.    LABS/IMAGING: No results found for this or any previous visit (from the past 48 hour(s)). No results found.  LIPID PANEL: No results found for: CHOL, TRIG, HDL, CHOLHDL, VLDL, LDLCALC, LDLDIRECT  WEIGHTS: Wt Readings from Last 3 Encounters:  03/05/21 252 lb 3.2 oz (114.4 kg)  02/12/21 260 lb (117.9 kg)  02/11/21 260 lb 6.4 oz (118.1 kg)    VITALS: BP 110/70 (BP Location: Left Arm, Patient Position: Sitting, Cuff Size: Large)    Pulse 95    Ht 6\' 3"  (1.905 m)    Wt 252 lb 3.2 oz (114.4 kg)    SpO2 98%    BMI 31.52 kg/m    EXAM: General appearance: alert and no distress Lungs: clear to auscultation bilaterally Heart: regular rate and rhythm, S1, S2 normal, no murmur, click, rub or gallop Extremities: extremities normal, atraumatic, no cyanosis or edema Neurologic: Grossly normal  EKG: Deferred  ASSESSMENT: Chest pain, concerning for unstable angina-abnormal coronary CT angiogram, CAC score 574, 98th percentile, severe mixed plaque in the proximal RCA and mild calcified plaque in the mid LAD, FFR could not be sent (01/2021) -cardiac cath demonstrated moderate disease of the proximal RCA and mid  circumflex (01/2021) Family history of premature coronary disease Probable GERD Type 2 diabetes  PLAN: 1.   Tim Richardson was found to have moderate nonobstructive coronary disease and will need aggressive medical therapy.  Continue aspirin and I will increase his rosuvastatin to 20 mg daily.  We will provide dietary information as well to help him continue to lose weight.  He should continue through with his GI work-up and upcoming colonoscopy/endoscopy.  Plan follow-up with me in 6 months or sooner as necessary.  Chrystie Nose, MD, [facility], FACP  [place]   [facility]  Medical Director of the Advanced Lipid Disorders &  Cardiovascular Risk Reduction Clinic Diplomate of the American Board of Clinical Lipidology Attending Cardiologist  Direct Dial: [phone]   Fax: [phone]  Website:  [email or web address] Nicely Rhianon Zabawa 03/05/2021, 1:34 PM

## 2021-03-05 NOTE — Patient Instructions (Signed)
Medication Instructions:  INCREASE rosuvastatin to 20mg  daily   *If you need a refill on your cardiac medications before your next appointment, please call your pharmacy*   Lab Work: FASTING lab work to check cholesterol in about 6 months -- complete about 1 week before your next visit with Dr.   If you have labs (blood work) drawn today and your tests are completely normal, you will receive your results only by: MyChart Message (if you have MyChart) OR A paper copy in the mail If you have any lab test that is abnormal or we need to change your treatment, we will call you to review the results.   Testing/Procedures: NONE   Follow-Up: At [facility], you and your health needs are our priority.  As part of our continuing mission to provide you with exceptional heart care, we have created designated Provider Care Teams.  These Care Teams include your primary Cardiologist (physician) and Advanced Practice Providers (APPs -  Physician Assistants and Nurse Practitioners) who all work together to provide you with the care you need, when you need it.  We recommend signing up for the patient portal called "MyChart".  Sign up information is provided on this After Visit Summary.  MyChart is used to connect with patients for Virtual Visits (Telemedicine).  Patients are able to view lab/test results, encounter notes, upcoming appointments, etc.  Non-urgent messages can be sent to your provider as well.   To learn more about what you can do with MyChart, go to [facility].    Your next appointment:   6 months with [email or web address]

## 2021-03-08 DIAGNOSIS — K635 Polyp of colon: Secondary | ICD-10-CM | POA: Diagnosis not present

## 2021-03-08 DIAGNOSIS — E119 Type 2 diabetes mellitus without complications: Secondary | ICD-10-CM | POA: Diagnosis not present

## 2021-03-08 DIAGNOSIS — Z1211 Encounter for screening for malignant neoplasm of colon: Secondary | ICD-10-CM | POA: Diagnosis not present

## 2021-03-09 ENCOUNTER — Telehealth: Payer: Self-pay | Admitting: Internal Medicine

## 2021-03-09 NOTE — Telephone Encounter (Signed)
Spoke with patient who reports calf pain and feet burning/pin-like feeling on soles of feet, with twitching, left side more than right side. He believes this started when he increased rosuvastatin from 10 mg to 20 mg. Please advise.

## 2021-03-09 NOTE — Telephone Encounter (Signed)
Pt c/o medication issue:  1. Name of Medication: rosuvastatin (CRESTOR) 20 MG tablet  2. How are you currently taking this medication (dosage and times per day)? Take 1 tablet (20 mg total) by mouth daily  3. Are you having a reaction (difficulty breathing--STAT)? no  4. What is your medication issue? Patient states since there was increase in his medication. His toes started feeling like pins and needles, the bottom of his feet burn, calves are achy and starting to have leg cramp.

## 2021-03-10 NOTE — Telephone Encounter (Signed)
Spoke with patient and informed him to stay on rosuvastatin 20 mg for 2 weeks and to contact us if symptoms do not improve or worsen. Pt. voiced understanding.

## 2021-03-10 NOTE — Telephone Encounter (Signed)
I just saw him on the 10th - he probably hasn't had more than 1-2 doses - this is atypical to have a reaction immediately. Would advise giving the medication at least 2 weeks. If the symptoms persist or worsen, he should let me know and we will stop the medicine to see if it improves.  Dr. Rennis Golden

## 2021-03-15 DIAGNOSIS — K635 Polyp of colon: Secondary | ICD-10-CM | POA: Diagnosis not present

## 2021-03-22 DIAGNOSIS — Z1211 Encounter for screening for malignant neoplasm of colon: Secondary | ICD-10-CM | POA: Diagnosis not present

## 2021-03-22 DIAGNOSIS — K219 Gastro-esophageal reflux disease without esophagitis: Secondary | ICD-10-CM | POA: Diagnosis not present

## 2021-03-22 DIAGNOSIS — E119 Type 2 diabetes mellitus without complications: Secondary | ICD-10-CM | POA: Diagnosis not present

## 2021-03-29 DIAGNOSIS — K200 Eosinophilic esophagitis: Secondary | ICD-10-CM | POA: Diagnosis not present

## 2021-03-31 DIAGNOSIS — Z6832 Body mass index (BMI) 32.0-32.9, adult: Secondary | ICD-10-CM | POA: Diagnosis not present

## 2021-03-31 DIAGNOSIS — M4726 Other spondylosis with radiculopathy, lumbar region: Secondary | ICD-10-CM | POA: Diagnosis not present

## 2021-03-31 DIAGNOSIS — R030 Elevated blood-pressure reading, without diagnosis of hypertension: Secondary | ICD-10-CM | POA: Diagnosis not present

## 2021-04-20 DIAGNOSIS — K259 Gastric ulcer, unspecified as acute or chronic, without hemorrhage or perforation: Secondary | ICD-10-CM | POA: Diagnosis not present

## 2021-04-20 DIAGNOSIS — K2270 Barrett's esophagus without dysplasia: Secondary | ICD-10-CM | POA: Diagnosis not present

## 2021-04-20 DIAGNOSIS — K219 Gastro-esophageal reflux disease without esophagitis: Secondary | ICD-10-CM | POA: Diagnosis not present

## 2021-04-20 DIAGNOSIS — D126 Benign neoplasm of colon, unspecified: Secondary | ICD-10-CM | POA: Diagnosis not present

## 2021-05-20 DIAGNOSIS — R945 Abnormal results of liver function studies: Secondary | ICD-10-CM | POA: Diagnosis not present

## 2021-05-20 DIAGNOSIS — K760 Fatty (change of) liver, not elsewhere classified: Secondary | ICD-10-CM | POA: Diagnosis not present

## 2021-05-20 DIAGNOSIS — K219 Gastro-esophageal reflux disease without esophagitis: Secondary | ICD-10-CM | POA: Diagnosis not present

## 2021-06-07 DIAGNOSIS — M4726 Other spondylosis with radiculopathy, lumbar region: Secondary | ICD-10-CM | POA: Diagnosis not present

## 2021-08-30 DIAGNOSIS — E785 Hyperlipidemia, unspecified: Secondary | ICD-10-CM | POA: Diagnosis not present

## 2021-08-30 DIAGNOSIS — E119 Type 2 diabetes mellitus without complications: Secondary | ICD-10-CM | POA: Diagnosis not present

## 2021-08-30 DIAGNOSIS — M67824 Other specified disorders of tendon, left elbow: Secondary | ICD-10-CM | POA: Diagnosis not present

## 2021-09-23 DIAGNOSIS — M5416 Radiculopathy, lumbar region: Secondary | ICD-10-CM | POA: Diagnosis not present

## 2021-12-27 DIAGNOSIS — M4726 Other spondylosis with radiculopathy, lumbar region: Secondary | ICD-10-CM | POA: Diagnosis not present

## 2022-01-02 ENCOUNTER — Emergency Department (HOSPITAL_COMMUNITY): Payer: BC Managed Care – PPO

## 2022-01-02 ENCOUNTER — Encounter (HOSPITAL_COMMUNITY): Payer: Self-pay

## 2022-01-02 ENCOUNTER — Emergency Department (HOSPITAL_COMMUNITY)
Admission: EM | Admit: 2022-01-02 | Discharge: 2022-01-02 | Disposition: A | Discharge status: Home / Self Care | Payer: BC Managed Care – PPO | Attending: Emergency Medicine | Admitting: Emergency Medicine

## 2022-01-02 ENCOUNTER — Other Ambulatory Visit: Payer: Self-pay

## 2022-01-02 DIAGNOSIS — K209 Esophagitis, unspecified without bleeding: Secondary | ICD-10-CM

## 2022-01-02 DIAGNOSIS — R55 Syncope and collapse: Secondary | ICD-10-CM

## 2022-01-02 DIAGNOSIS — Z7982 Long term (current) use of aspirin: Secondary | ICD-10-CM | POA: Diagnosis not present

## 2022-01-02 DIAGNOSIS — R072 Precordial pain: Secondary | ICD-10-CM | POA: Diagnosis not present

## 2022-01-02 DIAGNOSIS — K279 Peptic ulcer, site unspecified, unspecified as acute or chronic, without hemorrhage or perforation: Secondary | ICD-10-CM | POA: Diagnosis not present

## 2022-01-02 DIAGNOSIS — R079 Chest pain, unspecified: Secondary | ICD-10-CM | POA: Diagnosis not present

## 2022-01-02 LAB — CBC
HCT: 44.6 % (ref 39.0–52.0)
Hemoglobin: 14.8 g/dL (ref 13.0–17.0)
MCH: 28.6 pg (ref 26.0–34.0)
MCHC: 33.2 g/dL (ref 30.0–36.0)
MCV: 86.1 fL (ref 80.0–100.0)
Platelets: 234 10*3/uL (ref 150–400)
RBC: 5.18 MIL/uL (ref 4.22–5.81)
RDW: 13.5 % (ref 11.5–15.5)
WBC: 10.1 10*3/uL (ref 4.0–10.5)
nRBC: 0 % (ref 0.0–0.2)

## 2022-01-02 LAB — COMPREHENSIVE METABOLIC PANEL
ALT: 19 U/L (ref 0–44)
AST: 18 U/L (ref 15–41)
Albumin: 4.2 g/dL (ref 3.5–5.0)
Alkaline Phosphatase: 45 U/L (ref 38–126)
Anion gap: 9 (ref 5–15)
BUN: 12 mg/dL (ref 6–20)
CO2: 24 mmol/L (ref 22–32)
Calcium: 9.4 mg/dL (ref 8.9–10.3)
Chloride: 106 mmol/L (ref 98–111)
Creatinine, Ser: 1 mg/dL (ref 0.61–1.24)
GFR, Estimated: >60 mL/min (ref >60)
Glucose, Bld: 110 mg/dL — ABNORMAL HIGH (ref 70–99)
Potassium: 4.3 mmol/L (ref 3.5–5.1)
Sodium: 139 mmol/L (ref 135–145)
Total Bilirubin: 0.4 mg/dL (ref 0.3–1.2)
Total Protein: 7.1 g/dL (ref 6.5–8.1)

## 2022-01-02 LAB — TROPONIN I (HIGH SENSITIVITY)
Troponin I (High Sensitivity): <2 ng/L (ref <18)
Troponin I (High Sensitivity): 4 ng/L (ref <18)

## 2022-01-02 MED ORDER — SUCRALFATE 1 G PO TABS
1.0000 g | ORAL_TABLET | Freq: Once | ORAL | Status: AC
  Administered 2022-01-02: 1 g via ORAL
  Filled 2022-01-02: qty 1

## 2022-01-02 MED ORDER — PANTOPRAZOLE SODIUM 40 MG IV SOLR
40.0000 mg | Freq: Once | INTRAVENOUS | Status: AC
  Administered 2022-01-02: 40 mg via INTRAVENOUS
  Filled 2022-01-02: qty 10

## 2022-01-02 MED ORDER — PANTOPRAZOLE SODIUM 40 MG PO TBEC
40.0000 mg | DELAYED_RELEASE_TABLET | Freq: Two times a day (BID) | ORAL | 0 refills | Status: AC
Start: 2022-01-02 — End: ?

## 2022-01-02 MED ORDER — LIDOCAINE VISCOUS HCL 2 % MT SOLN
15.0000 mL | Freq: Once | OROMUCOSAL | Status: AC
  Administered 2022-01-02: 15 mL via ORAL
  Filled 2022-01-02: qty 15

## 2022-01-02 MED ORDER — ALUM & MAG HYDROXIDE-SIMETH 200-200-20 MG/5ML PO SUSP
30.0000 mL | Freq: Once | ORAL | Status: AC
  Administered 2022-01-02: 30 mL via ORAL
  Filled 2022-01-02: qty 30

## 2022-01-02 MED ORDER — SUCRALFATE 1 G PO TABS: 1.0000 g | ORAL_TABLET | Freq: Three times a day (TID) | ORAL | 0 refills | Status: AC

## 2022-01-02 MED ORDER — ALUM & MAG HYDROXIDE-SIMETH 200-200-20 MG/5ML PO SUSP
30.0000 mL | Freq: Once | ORAL | Status: DC
  Filled 2022-01-02: qty 30

## 2022-01-02 NOTE — Discharge Instructions (Addendum)
We signed the ER for chest pain, near fainting.  We suspect that you had a vasovagal event yesterday.  If you continue to have dizziness spells with near fainting, please call the primary care doctor for a close follow-up or return to the ER for further evaluation.  We suspected that your abdominal pain is because of esophagitis -we recommend that you start taking Protonix twice a day and Carafate as well.  Try to see if the GI doctors can see you earlier.  Return to the ER if you start having excruciating pain, bloody stools, bloody emesis.

## 2022-01-02 NOTE — ED Triage Notes (Addendum)
Patients centralized chest pain started yesterday while he was out shopping. He became pale and felt like he was going to pass out. No nausea. Radiation of pain to his left arm and left side of his neck. Said his blood pressure got low yesterday at 90/39. Patient has 2 blockages in his heart. One vessel is 40% and one is 50% blocked. Took 1 baby aspirin this morning.

## 2022-01-02 NOTE — ED Provider Notes (Signed)
[facility]-EMERGENCY DEPT Provider Note   CSN: [id number] Arrival date & time: 01/02/22  1806     History  Chief Complaint  Patient presents with   Chest Pain    Tim Richardson is a 51 y.o. male.  HPI     51 year old male with history of nonobstructive coronary artery disease comes in with chief complaint of midsternal chest pain and near syncope.  Yesterday patient was out shopping, he did indicate that on 2 separate occasions he felt like he was going to pass out.  Wife states that patient turned pale.  The episode lasted for less than 5 minutes.  Patient was nauseous.  He denies any palpitations, chest pains or shortness of breath at that time.  Today patient had few episodes of chest pain.  Chest pain is similar to his peptic ulcer disease pain, except for it is stronger.  Pain is described as burning and sharp pain.  Occasionally the pain is radiated to the left side.  Last year he was seen in the ER for similar chest pain.  He had cardiac workup which was reassuring besides nonobstructive CAD.  Subsequently he had GI workup that revealed ulcers and Barrett's esophagitis.  Home Medications Prior to Admission medications   Medication Sig Start Date End Date Taking? Authorizing Provider  pantoprazole (PROTONIX) 40 MG tablet Take 1 tablet (40 mg total) by mouth 2 (two) times daily. 01/02/22  Yes Derwood Kaplan, MD  sucralfate (CARAFATE) 1 g tablet Take 1 tablet (1 g total) by mouth 4 (four) times daily -  with meals and at bedtime. 01/02/22  Yes Derwood Kaplan, MD  acetaminophen (TYLENOL) 500 MG tablet Take 1,000 mg by mouth every 6 (six) hours as needed for headache. Patient not taking: Reported on 03/05/2021    [provider]  aspirin EC 81 MG tablet Take 81 mg by mouth daily. Swallow whole.    [provider]  Garlic 1000 MG CAPS Take 1,000 mg by mouth daily. Patient not taking: Reported on 03/05/2021    [provider]   ibuprofen (ADVIL,MOTRIN) 200 MG tablet Take 800 mg by mouth every 6 (six) hours as needed for mild pain or moderate pain. Patient not taking: Reported on 03/05/2021    [provider]  metFORMIN (GLUCOPHAGE) 500 MG tablet Take 500 mg by mouth 2 (two) times daily. 01/06/21   [provider]  Omega-3 1000 MG CAPS Take 1,000 mg by mouth daily.    [provider]  rosuvastatin (CRESTOR) 20 MG tablet Take 1 tablet (20 mg total) by mouth daily. 03/05/21   Hilty, Lisette Abu, MD  simethicone (MYLICON) 80 MG chewable tablet Chew 80 mg by mouth every 6 (six) hours as needed for flatulence.    [provider]  Vitamin D3 (VITAMIN D) 25 MCG tablet Take 1,000 Units by mouth daily.    [provider]  zinc gluconate 50 MG tablet Take 50 mg by mouth daily.    [provider]      Allergies    Patient has no known allergies.    Review of Systems   Review of Systems  All other systems reviewed and are negative.   Physical Exam Updated Vital Signs BP 129/68   Pulse 66   Temp 97.6 F (36.4 C)   Resp 15   Ht 6\' 3"  (1.905 m)   Wt 114.4 kg   SpO2 95%   BMI 31.52 kg/m  Physical Exam Vitals and nursing  note reviewed.  Constitutional:      Appearance: He is well-developed.  HENT:     Head: Atraumatic.  Cardiovascular:     Rate and Rhythm: Normal rate.  Pulmonary:     Effort: Pulmonary effort is normal.  Abdominal:     Palpations: Abdomen is soft.  Musculoskeletal:     Cervical back: Neck supple.  Skin:    General: Skin is warm.  Neurological:     Mental Status: He is alert and oriented to person, place, and time.     ED Results / Procedures / Treatments   Labs (all labs ordered are listed, but only abnormal results are displayed) Labs Reviewed  COMPREHENSIVE METABOLIC PANEL - Abnormal; Notable for the following components:      Result Value   Glucose, Bld 110 (*)    All other components within normal limits  CBC  TROPONIN I (HIGH  SENSITIVITY)  TROPONIN I (HIGH SENSITIVITY)    EKG EKG Interpretation  Date/Time:  Sunday January 02 2022 19:20:16 EST Ventricular Rate:  85 PR Interval:  170 QRS Duration: 95 QT Interval:  364 QTC Calculation: 433 R Axis:   82 Text Interpretation: Sinus rhythm ST elev, probable normal early repol pattern No acute changes No significant change since last tracing Confirmed by Derwood Kaplan [phone]) on 01/02/2022 9:14:32 PM  Radiology DG Chest 2 View  Result Date: 01/02/2022 CLINICAL DATA:  Chest pain EXAM: CHEST - 2 VIEW COMPARISON:  01/14/2021 FINDINGS: The heart size and mediastinal contours are within normal limits. Both lungs are clear. The visualized skeletal structures are unremarkable. IMPRESSION: No acute abnormality of the lungs. Electronically Signed   By: Jearld Lesch M.D.   On: 01/02/2022 19:37    Procedures Procedures    Medications Ordered in ED Medications  alum & mag hydroxide-simeth (MAALOX/MYLANTA) 200-200-20 MG/5ML suspension 30 mL (has no administration in time range)  sucralfate (CARAFATE) tablet 1 g (1 g Oral Given 01/02/22 2200)  pantoprazole (PROTONIX) injection 40 mg (40 mg Intravenous Given 01/02/22 2200)    ED Course/ Medical Decision Making/ A&P Clinical Course as of 01/02/22 2254  Sun Jan 02, 2022  2254 DG Chest 2 View X-ray interpreted independently.  No evidence of pneumothorax, no evidence of free air. [AN]  2254 Troponin I (High Sensitivity): 4 The patient appears reasonably screened and/or stabilized for discharge and I doubt any other medical condition or other [facility] requiring further screening, evaluation, or treatment in the ED at this time prior to discharge.   Results from the ER workup discussed with the patient face to face and all questions answered to the best of my ability. The patient is safe for discharge with strict return precautions.   [AN]    Clinical Course User Index [AN] Derwood Kaplan, MD                            Medical Decision Making 51 year old male comes in with chief complaint of chest pain and near syncope episode.  Yesterday he had 2 episodes of near syncope while he was shopping. There were no chest pain, shortness of breath episodes associate with it.  No palpitations.  Today patient has had worsening of his chronic chest pain that is because of esophagitis.  He has been checking his blood pressures more frequently and noted some low blood pressure readings in the 90s and 100s, prompting the family to bring him to the emergency room.  His cardiovascular  and GI exam are completely reassuring.  Basic labs ordered.  High-sensitivity troponin x 2 are reassuring. I reviewed patient's previous charts.  He had a coronary CT followed by cardiac cath last year which showed nonocclusive coronary artery disease.  Patient states that thereafter he was seen by GI and diagnosed with esophagitis.  Clinically, it appears that patient's pain is because of worsening esophagitis.  Differential diagnosis for near syncope includes: Orthostatic hypotension Stroke Dysrhythmia PE Vasovagal/neurocardiogenic syncope Aortic stenosis Valvular disorder/Cardiomyopathy Anemia  Differential diagnosis for the chest pain includes esophagitis, esophageal spasms, ACS.   Risk OTC drugs. Prescription drug management.    Final Clinical Impression(s) / ED Diagnoses Final diagnoses:  Esophagitis  PUD (peptic ulcer disease)  Vasovagal near syncope    Rx / DC Orders ED Discharge Orders          Ordered    sucralfate (CARAFATE) 1 g tablet  3 times daily with meals & bedtime        01/02/22 2242    pantoprazole (PROTONIX) 40 MG tablet  2 times daily        01/02/22 2242              Derwood Kaplan, MD 01/02/22 2254

## 2022-01-02 NOTE — ED Provider Triage Note (Signed)
Emergency Medicine Provider Triage Evaluation Note  Tim Richardson , a 51 y.o. male  was evaluated in triage.  Pt complains of chest pain.  Patient states that he was out shopping yesterday when he felt chest pain when he was walking down the feelings of his he was going to pass out.  Notes radiation of pain to left arm as well as left neck.  Describes pain as heaviness.  Reports taking blood pressure at home that was low.  Denies shortness of breath, fever, chills, cough, congestion, abdominal pain, nausea, vomiting..  Review of Systems  Positive: See above Negative:   Physical Exam  BP (!) 150/90 (BP Location: Right Arm)   Pulse 81   Temp 97.6 F (36.4 C) (Oral)   Resp 18   SpO2 100%  Gen:   Awake, no distress   Resp:  Normal effort  MSK:   Moves extremities without difficulty  Other:    Medical Decision Making  Medically screening exam initiated at 7:11 PM.  Appropriate orders placed.  Tim Richardson was informed that the remainder of the evaluation will be completed by another provider, this initial triage assessment does not replace that evaluation, and the importance of remaining in the ED until their evaluation is complete.     Peter Garter, [place] 01/02/22 [phone]

## 2022-01-11 DIAGNOSIS — R202 Paresthesia of skin: Secondary | ICD-10-CM | POA: Diagnosis not present

## 2022-01-14 ENCOUNTER — Encounter: Payer: Self-pay | Admitting: Neurology

## 2022-02-14 DIAGNOSIS — R1013 Epigastric pain: Secondary | ICD-10-CM | POA: Diagnosis not present

## 2022-02-14 DIAGNOSIS — K259 Gastric ulcer, unspecified as acute or chronic, without hemorrhage or perforation: Secondary | ICD-10-CM | POA: Diagnosis not present

## 2022-02-14 DIAGNOSIS — K219 Gastro-esophageal reflux disease without esophagitis: Secondary | ICD-10-CM | POA: Diagnosis not present

## 2022-02-17 NOTE — Progress Notes (Signed)
Initial neurology clinic note  SERVICE DATE: 02/24/22  Reason for Evaluation: Consultation requested by Tim Beals, NP for an opinion regarding numbness and cold feeling in extremities. My final recommendations will be communicated back to the requesting physician by way of shared medical record or letter to requesting physician via [place] mail.  HPI: This is Mr. Tim Richardson, a 52 y.o. right-handed male with a medical history of DM, HTN, HLD, CAD, abdominal pain and Barrett's esophagitis who presents to neurology clinic with the chief complaint of numbness, coldness, and burning pain in extremities. The patient is alone today.  He has been getting spinal injections (L4-5) for the last year because of sciatica after a fall 2 years ago. Patient had slipped in the shower. Patient had an injection ~12/28/21 and then 8 days later (~01/05/22), his feet go cold and tingling to ankles in both legs. The next day it was then up to calves. The next day it was to his lower thighs. Later that afternoon this was gone, but he then had burning on the top of his thighs. It lasted about 2 weeks. It then returned 2 weeks later (burning on thighs). He can have burning around his thighs into buttocks, and had burning into the groin once. It felt like he had icy hot on his thighs. He tried ice packs, which did help a little. The symptoms are currently coming and going. He has not had symptoms this week. But when it does occur, he cannot sleep with covers on him at night. Symptoms do seem worse at night. It seems like it may be better when he walks. It does not feel like he has the urge to move. He is not sure if it gets better with movement. He currently has cold toes and tingling around his ankles. It is the same on both sides. He denies weakness, imbalance, or falls.  He also has symptoms in his arms. This started ~01/08/22. He has tingling, crawling sensation on the posterior aspect of bilateral forearms from the  elbow to wrist, same on both sides. It comes and goes. He may also have had brief symptoms in his face.  Patient started taking B12 after symptoms started (taking for about 1 month now). He denies prior problems with vitamin deficiencies.  Patient has had GI problems for 1.5 years with flares. He had a work up significant for ulcers and Barrett's esophagitis. He continues to have GI problems.   He does not report any constitutional symptoms like fever, night sweats, anorexia or unintentional weight loss.  EtOH use: None  Restrictive diet? No Family history of neuropathy/myopathy/neurologic disease? No   MEDICATIONS:  Outpatient Encounter Medications as of 02/24/2022  Medication Sig   Amino Acids (AMINO ACID PO) Take by mouth.   aspirin EC 81 MG tablet Take 81 mg by mouth daily. Swallow whole.   Coenzyme Q10 (CO Q 10 PO) Take by mouth.   Folic Acid (FOLATE PO) Take by mouth.   Garlic 3419 MG CAPS Take 1,000 mg by mouth daily.   metFORMIN (GLUCOPHAGE) 500 MG tablet Take 500 mg by mouth 2 (two) times daily.   Omega-3 1000 MG CAPS Take 1,000 mg by mouth daily.   pantoprazole (PROTONIX) 40 MG tablet Take 1 tablet (40 mg total) by mouth 2 (two) times daily.   rosuvastatin (CRESTOR) 20 MG tablet Take 1 tablet (20 mg total) by mouth daily.   simethicone (MYLICON) 80 MG chewable tablet Chew 80 mg by mouth every 6 (  six) hours as needed for flatulence.   sucralfate (CARAFATE) 1 g tablet Take 1 tablet (1 g total) by mouth 4 (four) times daily -  with meals and at bedtime.   vitamin B-12 (CYANOCOBALAMIN) 100 MCG tablet Take 100 mcg by mouth daily.   Vitamin D3 (VITAMIN D) 25 MCG tablet Take 1,000 Units by mouth daily.   zinc gluconate 50 MG tablet Take 50 mg by mouth daily.   acetaminophen (TYLENOL) 500 MG tablet Take 1,000 mg by mouth every 6 (six) hours as needed for headache. (Patient not taking: Reported on 03/05/2021)   ibuprofen (ADVIL,MOTRIN) 200 MG tablet Take 800 mg by mouth every 6 (six)  hours as needed for mild pain or moderate pain. (Patient not taking: Reported on 03/05/2021)   No facility-administered encounter medications on file as of 02/24/2022.    PAST MEDICAL HISTORY: Past Medical History:  Diagnosis Date   Allergy    Arthritis    Diabetes mellitus without complication (HCC)    GERD (gastroesophageal reflux disease)    Ulcer     PAST SURGICAL HISTORY: Past Surgical History:  Procedure Laterality Date   HERNIA REPAIR     LEFT HEART CATH AND CORONARY ANGIOGRAPHY N/A 02/12/2021   Procedure: LEFT HEART CATH AND CORONARY ANGIOGRAPHY;  Surgeon: Leonie Man, MD;  Location: [facility];  Service: Cardiovascular;  Laterality: N/A;   SHOULDER SURGERY      ALLERGIES: No Known Allergies  FAMILY HISTORY: Family History  Problem Relation Age of Onset   Diabetes Mother    Heart disease Mother    Stroke Mother    Heart disease Father    Hyperlipidemia Father    Heart disease Sister    Mental retardation Sister     SOCIAL HISTORY: Social History   Tobacco Use   Smoking status: Former   Smokeless tobacco: Current    Types: Snuff   Tobacco comments:    Dip  Vaping Use   Vaping Use: Never used  Substance Use Topics   Alcohol use: No   Drug use: No   Social History   Social History Narrative   Right Handed    Lives in a two story home. Lives with wife, daughter and family.      OBJECTIVE: PHYSICAL EXAM: BP 119/80   Pulse 69   Ht 6\' 3"  (1.905 m)   Wt 249 lb (112.9 kg)   SpO2 99%   BMI 31.12 kg/m   General: General appearance: Awake and alert. No distress. Cooperative with exam.  Skin: No obvious rash or jaundice. HEENT: Atraumatic. Anicteric. Lungs: Non-labored breathing on room air  Extremities: No edema. Deformity of left 5th digit (prior fracture) Musculoskeletal: No obvious joint swelling. Psych: Affect appropriate.  Neurological: Mental Status: Alert. Speech fluent. No pseudobulbar affect Cranial Nerves: CNII: No  RAPD. Visual fields grossly intact. CNIII, IV, VI: PERRL. No nystagmus. EOMI. CN V: Facial sensation intact bilaterally to fine touch. CN VII: Facial muscles symmetric and strong. No ptosis at rest. CN VIII: Hearing grossly intact bilaterally. CN IX: No hypophonia. CN X: Palate elevates symmetrically. CN XI: Full strength shoulder shrug bilaterally. CN XII: Tongue protrusion full and midline. No atrophy or fasciculations. No significant dysarthria Motor: Tone is normal. No fasciculations in extremities. No atrophy.  Individual muscle group testing (MRC grade out of 5):  Movement     Neck flexion 5    Neck extension 5     Right Left   Shoulder abduction 5 5  Shoulder adduction 5 5   Shoulder ext rotation 5 5   Shoulder int rotation 5 5   Elbow flexion 5 5   Elbow extension 5 5   Wrist extension 5 5   Wrist flexion 5 5   Finger abduction - FDI 5 5   Finger abduction - ADM 5 5   Finger extension 5 5   Finger distal flexion - 2/3 5 5    Finger distal flexion - 4/5 5 5    Thumb flexion - FPL 5 5   Thumb abduction - APB 5 5    Hip flexion 5 5   Hip extension 5 5   Hip adduction 5 5   Hip abduction 5 5   Knee extension 5 5   Knee flexion 5 5   Dorsiflexion 5 5   Plantarflexion 5 5   Inversion 5 5   Eversion 5 5   Great toe extension 5 5   Great toe flexion 5 5     Reflexes:  Right Left   Bicep 2+ 2+   Tricep 2+ 2+   BrRad 2+ 2+   Knee 2+ 2+   Ankle 2+ 2+    Pathological Reflexes: Babinski: flexor response bilaterally Hoffman: absent bilaterally Troemner: absent bilaterally Sensation: Pinprick: Intact in all extremities Vibration: Intact in all extremities Proprioception: Intact in bilateral great toes Coordination: Intact finger-to- nose-finger bilaterally. Romberg negative. Gait: Able to rise from chair with arms crossed unassisted. Normal, narrow-based gait.  Lab and Test Review: Internal labs: 01/02/22: Normal or unremarkable: CBC CMP with mildly  elevated glucose (110)  External labs: A1c (08/30/21): 5.6; (02/18/21): 6.4  Imaging: MRI lumbar spine wo contrast (11/16/20): FINDINGS: Segmentation:  5 non rib-bearing lumbar vertebral bodies.   Alignment:  No substantial sagittal subluxation.   Vertebrae: Vertebral body heights are maintained. No focal marrow edema to suggest acute fracture or discitis/osteomyelitis. Benign vertebral venous malformations at multiple levels. No suspicious bone lesions. Small degenerative Schmorl's nodes multiple levels.   Conus medullaris and cauda equina: Conus extends to the L1-L2 level. Conus appears normal.   Paraspinal and other soft tissues: Unremarkable.   Disc levels:   T12-L1: Small disc protrusion without significant stenosis.   L1-L2: No significant disc protrusion, foraminal stenosis, or canal stenosis.   L2-L3: No significant disc protrusion, foraminal stenosis, or canal stenosis.   L3-L4: Mild facet arthropathy without significant stenosis.   L4-L5: Small broad disc bulge with superimposed central disc protrusion and small bilateral foraminal disc protrusions. Mild bilateral facet arthropathy. Resulting mild bilateral foraminal stenosis without significant canal stenosis. Right far lateral disc closely approximates the exiting right L4 nerve (series 5, image 4; series 13, image 34).   L5-S1: Bilateral foraminal disc protrusions. Far lateral disc closely approximates the exiting L5 nerves bilaterally with mild bilateral foraminal stenosis.   IMPRESSION: 1. 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 Far lateral disc closely approximates the exiting right L4 and bilateral L5 nerves. 2. No significant canal stenosis.  ASSESSMENT: JAMEAL RAZZANO is a 52 y.o. male who presents for evaluation of tingling and burning in legs and arms. He has a relevant medical history of DM, HTN, HLD, CAD, abdominal pain and Barrett's esophagitis. His neurological examination is  essentially normal today. Available diagnostic data is significant for MRI lumbar spine showing mild bilateral foraminal stenosis at L4-5 and L5-S1. The etiology of patient's symptoms is currently unclear. They do not match a clear nerve or dermatome distribution. Given the history of low  back trauma, pain, and injections and the history of changes in sensation to groin, a lumbar spine/cauda equina/conus compression is important to evaluate. This would not explain symptoms in his arms or possibly face though, but these symptoms did come later. There is no clear signs of neuropathy, but I will look for treatable causes including porphyria given history of abdominal pain. I will consider further work up or treatment after this testing is complete.  PLAN: -Blood work: B1, B12, IFE, iron studies, CK, urine porphyrins, porphobilinogen, and creatinine -MRI lumbar spine wo contrast -Lidocaine cream PRN  -Return to clinic in 1 month  The impression above as well as the plan as outlined below were extensively discussed with the patient who voiced understanding. All questions were answered to their satisfaction.  When available, results of the above investigations and possible further recommendations will be communicated to the patient via telephone/MyChart. Patient to call office if not contacted after expected testing turnaround time.   Total time spent reviewing records, interview, history/exam, documentation, and coordination of care on day of encounter:  70 min   Thank you for allowing me to participate in patient's care.  If I can answer any additional questions, I would be pleased to do so.  Kai Levins, MD   CC: Tim Beals, NP [place] [id number]  [place]: Referring provider: Everardo Richardson, [place] [address] [place],  [place] [address]

## 2022-02-21 DIAGNOSIS — E119 Type 2 diabetes mellitus without complications: Secondary | ICD-10-CM | POA: Diagnosis not present

## 2022-02-24 ENCOUNTER — Other Ambulatory Visit (INDEPENDENT_AMBULATORY_CARE_PROVIDER_SITE_OTHER): Payer: BC Managed Care – PPO

## 2022-02-24 ENCOUNTER — Encounter: Payer: Self-pay | Admitting: Neurology

## 2022-02-24 ENCOUNTER — Ambulatory Visit: Payer: BC Managed Care – PPO | Admitting: Neurology

## 2022-02-24 VITALS — BP 119/80 | HR 69 | Ht 75.0 in | Wt 249.0 lb

## 2022-02-24 DIAGNOSIS — R1084 Generalized abdominal pain: Secondary | ICD-10-CM

## 2022-02-24 DIAGNOSIS — M5442 Lumbago with sciatica, left side: Secondary | ICD-10-CM

## 2022-02-24 DIAGNOSIS — R200 Anesthesia of skin: Secondary | ICD-10-CM

## 2022-02-24 DIAGNOSIS — M5441 Lumbago with sciatica, right side: Secondary | ICD-10-CM

## 2022-02-24 DIAGNOSIS — G8929 Other chronic pain: Secondary | ICD-10-CM | POA: Diagnosis not present

## 2022-02-24 DIAGNOSIS — R1030 Lower abdominal pain, unspecified: Secondary | ICD-10-CM

## 2022-02-24 DIAGNOSIS — R202 Paresthesia of skin: Secondary | ICD-10-CM

## 2022-02-24 LAB — CK: Total CK: 132 U/L (ref 7–232)

## 2022-02-24 LAB — VITAMIN B12: Vitamin B-12: 369 pg/mL (ref 211–911)

## 2022-02-24 NOTE — Patient Instructions (Addendum)
Your symptoms are currently unclear. I would like to investigate further with the following: -Blood work and urine studies today looking for vitamin problems or abnormal proteins in the blood that might cause your symptoms -MRI of your lumbar spine. While this would explain symptoms in your arms, given your numbness/burning in your groin, I have to make sure the bottom of your spine cord is not being pinched, especially given your prior back problems and injury  I will be in touch when I have your results.  For your symptoms, you can also try Lidocaine cream as needed. Apply wear you have pain, tingling, or burning. Wear gloves to prevent your hands being numb. This can be bought over the counter at any drug store or online.  I would like to see you again in 1 month to recheck your symptoms and examination and further work up or treatment needed.  Please let me know if you have any questions or concerns in the meantime.  The physicians and staff at [facility] are committed to providing excellent care. You may receive a survey requesting feedback about your experience at our office. We strive to receive "very good" responses to the survey questions. If you feel that your experience would prevent you from giving the office a "very good " response, please contact our office to try to remedy the situation. We may be reached at [phone]. Thank you for taking the time out of your busy day to complete the survey.  Kai Levins, MD [facility]

## 2022-02-28 LAB — PORPHOBILINOGEN, RANDOM URINE: Porphobilinogen, Rand Ur: 0.5 mg/L (ref 0.0–2.0)

## 2022-03-03 DIAGNOSIS — K2930 Chronic superficial gastritis without bleeding: Secondary | ICD-10-CM | POA: Diagnosis not present

## 2022-03-03 DIAGNOSIS — R1013 Epigastric pain: Secondary | ICD-10-CM | POA: Diagnosis not present

## 2022-03-03 DIAGNOSIS — K2270 Barrett's esophagus without dysplasia: Secondary | ICD-10-CM | POA: Diagnosis not present

## 2022-03-03 DIAGNOSIS — K219 Gastro-esophageal reflux disease without esophagitis: Secondary | ICD-10-CM | POA: Diagnosis not present

## 2022-03-04 LAB — PORPHYRINS, FRACTIONATED, RANDOM URINE
COPROPORPHYRIN I: 11.6 mcg/g creat (ref 6.5–33.2)
Coproporphyrin III (PORFRU): 4 mcg/g creat — ABNORMAL LOW (ref 4.8–88.6)
HEPTACARBOXYPORPHYRIN: 1.3 mcg/g creat (ref <=3.4)
TOTAL PORPHYRINS: 33.5 mcg/g creat (ref 27.0–153.6)
Uroporphyrin I: 14.8 mcg/g creat (ref 3.6–21.1)
Uroporphyrin III (PORFRU): 1.8 mcg/g creat (ref <=5.6)

## 2022-03-04 LAB — CREATININE, URINE, RANDOM: Creatinine, Urine: 71 mg/dL (ref 20–320)

## 2022-03-04 LAB — IRON,TIBC AND FERRITIN PANEL
%SAT: 23 % (calc) (ref 20–48)
Ferritin: 42 ng/mL (ref 38–380)
Iron: 79 ug/dL (ref 50–180)
TIBC: 342 mcg/dL (calc) (ref 250–425)

## 2022-03-04 LAB — VITAMIN B1: Vitamin B1 (Thiamine): 9 nmol/L (ref 8–30)

## 2022-03-04 LAB — IMMUNOFIXATION ELECTROPHORESIS
IgG (Immunoglobin G), Serum: 557 mg/dL — ABNORMAL LOW (ref 600–1640)
IgM, Serum: 124 mg/dL (ref 50–300)
Immunoglobulin A: 131 mg/dL (ref 47–310)

## 2022-03-11 DIAGNOSIS — R0981 Nasal congestion: Secondary | ICD-10-CM | POA: Diagnosis not present

## 2022-03-13 ENCOUNTER — Ambulatory Visit
Admission: RE | Admit: 2022-03-13 | Discharge: 2022-03-13 | Disposition: A | Discharge status: Home / Self Care | Payer: BC Managed Care – PPO | Source: Ambulatory Visit | Attending: Neurology | Admitting: Neurology

## 2022-03-13 DIAGNOSIS — G8929 Other chronic pain: Secondary | ICD-10-CM

## 2022-03-13 DIAGNOSIS — R200 Anesthesia of skin: Secondary | ICD-10-CM

## 2022-03-13 DIAGNOSIS — M47816 Spondylosis without myelopathy or radiculopathy, lumbar region: Secondary | ICD-10-CM | POA: Diagnosis not present

## 2022-03-13 DIAGNOSIS — M5126 Other intervertebral disc displacement, lumbar region: Secondary | ICD-10-CM | POA: Diagnosis not present

## 2022-03-17 NOTE — Progress Notes (Signed)
NEUROLOGY FOLLOW UP OFFICE NOTE  JORDAAN CURET UI:[id number]  Subjective:  Tim Richardson is a 52 y.o. year old right-handed male with a medical history of DM, HTN, HLD, CAD, abdominal pain and Barrett's esophagitis who we last saw on 02/24/22.  To briefly review: He has been getting spinal injections (L4-5) for the last year because of sciatica after a fall 2 years ago. Patient had slipped in the shower. Patient had an injection ~12/28/21 and then 8 days later (~01/05/22), his feet go cold and tingling to ankles in both legs. The next day it was then up to calves. The next day it was to his lower thighs. Later that afternoon this was gone, but he then had burning on the top of his thighs. It lasted about 2 weeks. It then returned 2 weeks later (burning on thighs). He can have burning around his thighs into buttocks, and had burning into the groin once. It felt like he had icy hot on his thighs. He tried ice packs, which did help a little. The symptoms are currently coming and going. He has not had symptoms this week. But when it does occur, he cannot sleep with covers on him at night. Symptoms do seem worse at night. It seems like it may be better when he walks. It does not feel like he has the urge to move. He is not sure if it gets better with movement. He currently has cold toes and tingling around his ankles. It is the same on both sides. He denies weakness, imbalance, or falls.   He also has symptoms in his arms. This started ~01/08/22. He has tingling, crawling sensation on the posterior aspect of bilateral forearms from the elbow to wrist, same on both sides. It comes and goes. He may also have had brief symptoms in his face.   Patient started taking B12 after symptoms started (taking for about 1 month now). He denies prior problems with vitamin deficiencies.   Patient has had GI problems for 1.5 years with flares. He had a work up significant for ulcers and Barrett's esophagitis. He continues  to have GI problems.    He does not report any constitutional symptoms like fever, night sweats, anorexia or unintentional weight loss.   EtOH use: None  Restrictive diet? No Family history of neuropathy/myopathy/neurologic disease? No  Most recent Assessment and Plan (02/24/22): The etiology of patient's symptoms is currently unclear. They do not match a clear nerve or dermatome distribution. Given the history of low back trauma, pain, and injections and the history of changes in sensation to groin, a lumbar spine/cauda equina/conus compression is important to evaluate. This would not explain symptoms in his arms or possibly face though, but these symptoms did come later. There is no clear signs of neuropathy, but I will look for treatable causes including porphyria given history of abdominal pain. I will consider further work up or treatment after this testing is complete.   PLAN: -Blood work: B1, B12, IFE, iron studies, CK, urine porphyrins, porphobilinogen, and creatinine -MRI lumbar spine wo contrast -Lidocaine cream PRN  Since their last visit: Labs did not show any significant abnormalities. Ferritin was borderline low at 42.   MRI lumbar spine showed encroachment of right L4 and bilateral L5 roots, but similar to prior from 10/2020.  Overall, patient's symptoms are less frequency and not as intense since last visit. He finds his symptoms are worse when he works out or pushes something heavy in his shop.  His symptoms are mostly in the legs. He has only a couple of times had very mild symptoms in his arms. He has not had symptoms in his face. He denies any significant restless leg symptoms.  Patient was seen by spine recently. He may have another lumbar injection, but he is not sure if he wants it.  Patient has not done PT recently.   MEDICATIONS:  Outpatient Encounter Medications as of 03/25/2022  Medication Sig   Amino Acids (AMINO ACID PO) Take by mouth. prn   aspirin EC 81 MG  tablet Take 81 mg by mouth daily. Swallow whole.   Coenzyme Q10 (CO Q 10 PO) Take by mouth.   Folic Acid (FOLATE PO) Take by mouth.   Garlic [id number] MG CAPS Take 1,000 mg by mouth daily.   metFORMIN (GLUCOPHAGE) 500 MG tablet Take 500 mg by mouth 2 (two) times daily.   NON FORMULARY Take 500 mg by mouth daily.   Omega-3 1000 MG CAPS Take 1,000 mg by mouth daily.   pantoprazole (PROTONIX) 40 MG tablet Take 1 tablet (40 mg total) by mouth 2 (two) times daily.   rosuvastatin (CRESTOR) 20 MG tablet Take 1 tablet (20 mg total) by mouth daily.   sucralfate (CARAFATE) 1 g tablet Take 1 tablet (1 g total) by mouth 4 (four) times daily -  with meals and at bedtime.   vitamin B-12 (CYANOCOBALAMIN) 100 MCG tablet Take 100 mcg by mouth daily.   Vitamin D3 (VITAMIN D) 25 MCG tablet Take 1,000 Units by mouth daily.   zinc gluconate 50 MG tablet Take 50 mg by mouth daily.   acetaminophen (TYLENOL) 500 MG tablet Take 1,000 mg by mouth every 6 (six) hours as needed for headache. (Patient not taking: Reported on 03/05/2021)   ibuprofen (ADVIL,MOTRIN) 200 MG tablet Take 800 mg by mouth every 6 (six) hours as needed for mild pain or moderate pain. (Patient not taking: Reported on 03/05/2021)   simethicone (MYLICON) 80 MG chewable tablet Chew 80 mg by mouth every 6 (six) hours as needed for flatulence. (Patient not taking: Reported on 03/25/2022)   No facility-administered encounter medications on file as of 03/25/2022.    PAST MEDICAL HISTORY: Past Medical History:  Diagnosis Date   Allergy    Arthritis    Diabetes mellitus without complication (HCC)    GERD (gastroesophageal reflux disease)    Ulcer     PAST SURGICAL HISTORY: Past Surgical History:  Procedure Laterality Date   HERNIA REPAIR     LEFT HEART CATH AND CORONARY ANGIOGRAPHY N/A 02/12/2021   Procedure: LEFT HEART CATH AND CORONARY ANGIOGRAPHY;  Surgeon: Leonie Man, MD;  Location: [facility];  Service: Cardiovascular;  Laterality: N/A;    SHOULDER SURGERY      ALLERGIES: No Known Allergies  FAMILY HISTORY: Family History  Problem Relation Age of Onset   Diabetes Mother    Heart disease Mother    Stroke Mother    Heart disease Father    Hyperlipidemia Father    Heart disease Sister    Mental retardation Sister     SOCIAL HISTORY: Social History   Tobacco Use   Smoking status: Former   Smokeless tobacco: Current    Types: Snuff   Tobacco comments:    Dip  Vaping Use   Vaping Use: Never used  Substance Use Topics   Alcohol use: No   Drug use: No   Social History   Social History Narrative   Right  Handed    Lives in a two story home. Lives with wife, daughter and family.    Caffeine one or two a week   Painter      Objective:  Vital Signs:  BP (!) 144/80   Pulse 84   Ht '6\' 3"'$  (1.905 m)   Wt 253 lb (114.8 kg)   SpO2 99%   BMI 31.62 kg/m   General: No acute distress.  Patient appears well-groomed.   Head:  Normocephalic/atraumatic Neck: supple  Neurological Exam: Mental status: alert and oriented, speech fluent and not dysarthric, language intact.  Cranial nerves: CN I: not tested CN II: pupils equal, round and reactive to light, visual fields intact CN III, IV, VI:  full range of motion, no nystagmus, no ptosis CN V: facial sensation intact. CN VII: upper and lower face symmetric CN VIII: hearing intact CN IX, X: gag intact, uvula midline CN XI: sternocleidomastoid and trapezius muscles intact CN XII: tongue midline  Bulk & Tone: normal, no fasciculations. Motor:  muscle strength 5/5 throughout Deep Tendon Reflexes:  2+ throughout,  toes downgoing.   Sensation:  Pinprick sensation intact.  Gait:  Normal station and stride.  Labs and Imaging review: New results: 02/24/22: Porphyrins: within normal limits (wnl) Urine Cr: wnl B1: 9 B12: 369 IFE: no M protein CK: 132 Iron studies: wnl; ferritin borderline low at 42  MRI lumbar spine wo contrast  (03/13/22): FINDINGS: Segmentation:  Standard.   Alignment:  Slight dextroconvex curvature.   Vertebrae: No evidence of acute fracture, discitis, or aggressive osseous lesion. There are unchanged vertebral body hemangiomas in T12, L1, and L2.   Conus medullaris and cauda equina: Conus extends to the L1-L2 level. Conus and cauda equina appear normal.   Paraspinal and other soft tissues: Negative.   Disc levels:   T11-T12: No significant spinal canal or neural foraminal narrowing.   T12-L1: Minimal asymmetric right disc bulging. No significant spinal canal or neural foraminal stenosis.   L1-L2: No significant spinal canal or neural foraminal narrowing.   L2-L3: No significant spinal canal or neural foraminal narrowing.   L3-L4: Mild bilateral facet arthropathy. No significant spinal canal or neural foraminal stenosis.   L4-L5: Mild disc bulging with shallow central and foraminal disc protrusions. Mild bilateral neural foraminal stenosis. No significant spinal canal stenosis. Unchanged for lateral disc encroaching the exiting right L4 nerve.   L5-S1: Foraminal disc protrusions with mild neural foraminal stenosis bilaterally and unchanged lateral disc encroachment of the exiting L5 nerve roots. No significant spinal canal stenosis.   Findings are overall similar to prior exam in October 2022.   IMPRESSION: Mild bilateral neural foraminal stenosis at L4-L5 and L5-S1 with far lateral disc encroachment of the exiting right L4 nerve and bilateral exiting L5 nerve roots. No significant spinal canal stenosis in the lumbar spine. Findings are similar to prior exam in October 2022.  Previously reviewed results: Internal labs: 01/02/22: Normal or unremarkable: CBC CMP with mildly elevated glucose (110)   External labs: A1c (08/30/21): 5.6; (02/18/21): 6.4   Imaging: MRI lumbar spine wo contrast (11/16/20): FINDINGS: Segmentation:  5 non rib-bearing lumbar vertebral bodies.    Alignment:  No substantial sagittal subluxation.   Vertebrae: Vertebral body heights are maintained. No focal marrow edema to suggest acute fracture or discitis/osteomyelitis. Benign vertebral venous malformations at multiple levels. No suspicious bone lesions. Small degenerative Schmorl's nodes multiple levels.   Conus medullaris and cauda equina: Conus extends to the L1-L2 level. Conus appears normal.  Paraspinal and other soft tissues: Unremarkable.   Disc levels:   T12-L1: Small disc protrusion without significant stenosis.   L1-L2: No significant disc protrusion, foraminal stenosis, or canal stenosis.   L2-L3: No significant disc protrusion, foraminal stenosis, or canal stenosis.   L3-L4: Mild facet arthropathy without significant stenosis.   L4-L5: Small broad disc bulge with superimposed central disc protrusion and small bilateral foraminal disc protrusions. Mild bilateral facet arthropathy. Resulting mild bilateral foraminal stenosis without significant canal stenosis. Right far lateral disc closely approximates the exiting right L4 nerve (series 5, image 4; series 13, image 34).   L5-S1: Bilateral foraminal disc protrusions. Far lateral disc closely approximates the exiting L5 nerves bilaterally with mild bilateral foraminal stenosis.   IMPRESSION: 1. 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 Far lateral disc closely approximates the exiting right L4 and bilateral L5 nerves. 2. No significant canal stenosis.  Assessment/Plan:  This is Tim Richardson, a 52 y.o. male with back pain and intermittent lower extremity paresthesias. Patient's symptoms are present mostly when moving heavy things or standing/working out. His MRI is similar to prior but with bilateral foraminal stenosis at L4-5 and L5-S1. His symptoms seem most compatible with a mild lumbosacral radiculopathy.   Plan: -Physical therapy for low back pain and possible lumbosacral  radiculopathy -Consider further injections with spine if needed  Return to clinic as needed  Total time spent reviewing records, interview, history/exam, documentation, and coordination of care on day of encounter:  25 min  Kai Levins, MD

## 2022-03-19 IMAGING — DX DG CHEST 1V PORT
2 series · 2 of 2 positions shown · non-contrast
Comparison: January 05, 2016.

CLINICAL DATA: A 50-year-old male presents for evaluation of
LEFT-sided chest pain and intermittent jaw pain.

EXAM:
PORTABLE CHEST 1 VIEW

[chest ap (1 of 2)]
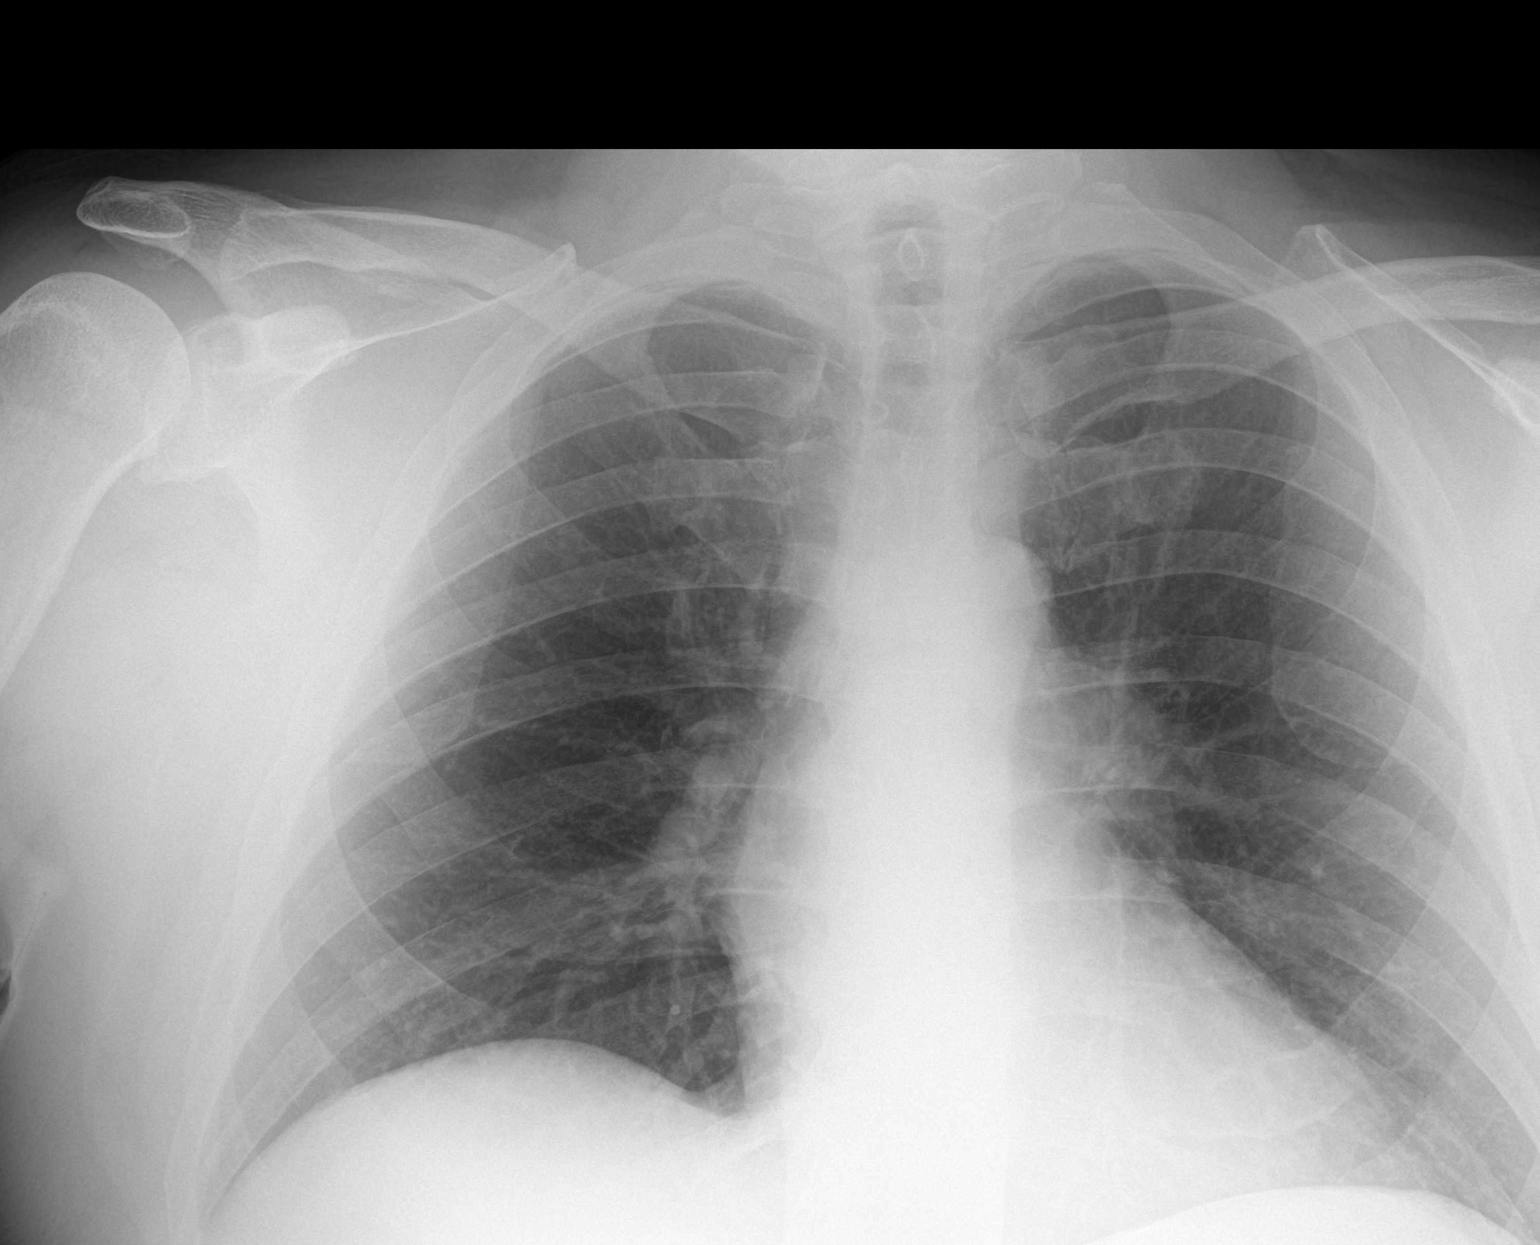

[chest ap (2 of 2)]
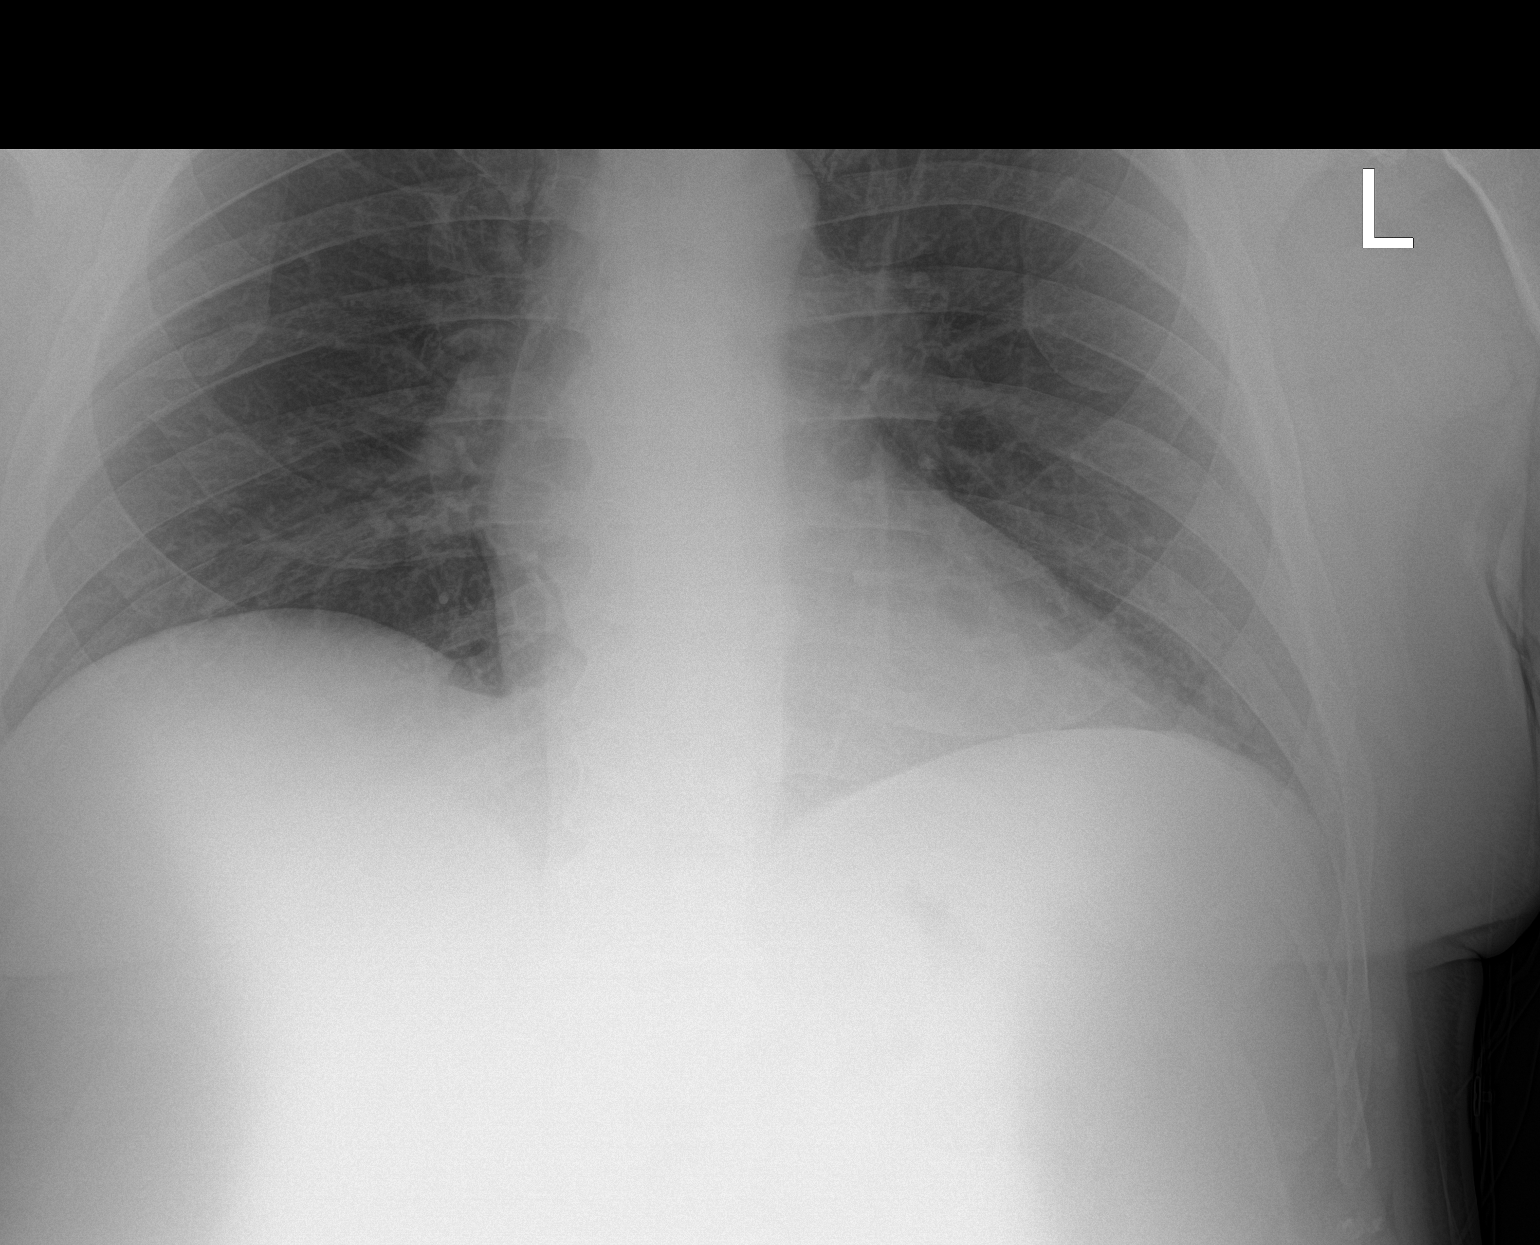

[2 of 2 positions shown; findings below may reference images not displayed]

FINDINGS: Cardiomediastinal contours and hilar structures are normal. Lungs
are clear. No gross effusion. No visible pneumothorax.

On limited assessment there is no acute skeletal process.
IMPRESSION: No acute cardiopulmonary disease.

## 2022-03-21 DIAGNOSIS — M4726 Other spondylosis with radiculopathy, lumbar region: Secondary | ICD-10-CM | POA: Diagnosis not present

## 2022-03-25 ENCOUNTER — Ambulatory Visit: Payer: BC Managed Care – PPO | Admitting: Neurology

## 2022-03-25 ENCOUNTER — Encounter: Payer: Self-pay | Admitting: Neurology

## 2022-03-25 DIAGNOSIS — M5441 Lumbago with sciatica, right side: Secondary | ICD-10-CM

## 2022-03-25 DIAGNOSIS — R200 Anesthesia of skin: Secondary | ICD-10-CM

## 2022-03-25 DIAGNOSIS — R202 Paresthesia of skin: Secondary | ICD-10-CM | POA: Diagnosis not present

## 2022-03-25 DIAGNOSIS — G8929 Other chronic pain: Secondary | ICD-10-CM

## 2022-03-25 DIAGNOSIS — M5442 Lumbago with sciatica, left side: Secondary | ICD-10-CM

## 2022-03-25 NOTE — Patient Instructions (Signed)
I am referring you to physical therapy for your back to see if this helps the symptoms in your legs.  Please call if you have new or worsening symptoms. I can always bring you back into clinic to re-evaluate you.  The physicians and staff at [facility] are committed to providing excellent care. You may receive a survey requesting feedback about your experience at our office. We strive to receive "very good" responses to the survey questions. If you feel that your experience would prevent you from giving the office a "very good " response, please contact our office to try to remedy the situation. We may be reached at [phone]. Thank you for taking the time out of your busy day to complete the survey.  Kai Levins, MD [facility]

## 2022-04-29 DIAGNOSIS — Z86010 Personal history of colonic polyps: Secondary | ICD-10-CM | POA: Diagnosis not present

## 2022-04-29 DIAGNOSIS — K760 Fatty (change of) liver, not elsewhere classified: Secondary | ICD-10-CM | POA: Diagnosis not present

## 2022-04-29 DIAGNOSIS — R1013 Epigastric pain: Secondary | ICD-10-CM | POA: Diagnosis not present

## 2022-04-29 DIAGNOSIS — K219 Gastro-esophageal reflux disease without esophagitis: Secondary | ICD-10-CM | POA: Diagnosis not present

## 2022-06-14 ENCOUNTER — Other Ambulatory Visit: Payer: Self-pay

## 2022-06-14 ENCOUNTER — Emergency Department (HOSPITAL_COMMUNITY)
Admission: EM | Admit: 2022-06-14 | Discharge: 2022-06-14 | Disposition: A | Discharge status: Home / Self Care | Payer: BC Managed Care – PPO | Attending: Emergency Medicine | Admitting: Emergency Medicine

## 2022-06-14 ENCOUNTER — Encounter (HOSPITAL_COMMUNITY): Payer: Self-pay

## 2022-06-14 ENCOUNTER — Telehealth: Payer: Self-pay | Admitting: Internal Medicine

## 2022-06-14 ENCOUNTER — Emergency Department (HOSPITAL_COMMUNITY): Payer: BC Managed Care – PPO

## 2022-06-14 DIAGNOSIS — R0789 Other chest pain: Secondary | ICD-10-CM | POA: Diagnosis not present

## 2022-06-14 DIAGNOSIS — I2510 Atherosclerotic heart disease of native coronary artery without angina pectoris: Secondary | ICD-10-CM | POA: Diagnosis not present

## 2022-06-14 DIAGNOSIS — R079 Chest pain, unspecified: Secondary | ICD-10-CM | POA: Insufficient documentation

## 2022-06-14 LAB — CBC
HCT: 41.3 % (ref 39.0–52.0)
Hemoglobin: 14 g/dL (ref 13.0–17.0)
MCH: 28.1 pg (ref 26.0–34.0)
MCHC: 33.9 g/dL (ref 30.0–36.0)
MCV: 82.9 fL (ref 80.0–100.0)
Platelets: 207 10*3/uL (ref 150–400)
RBC: 4.98 MIL/uL (ref 4.22–5.81)
RDW: 13.8 % (ref 11.5–15.5)
WBC: 8.1 10*3/uL (ref 4.0–10.5)
nRBC: 0 % (ref 0.0–0.2)

## 2022-06-14 LAB — BASIC METABOLIC PANEL
Anion gap: 11 (ref 5–15)
BUN: 10 mg/dL (ref 6–20)
CO2: 25 mmol/L (ref 22–32)
Calcium: 9.3 mg/dL (ref 8.9–10.3)
Chloride: 101 mmol/L (ref 98–111)
Creatinine, Ser: 1.16 mg/dL (ref 0.61–1.24)
GFR, Estimated: >60 mL/min (ref >60)
Glucose, Bld: 197 mg/dL — ABNORMAL HIGH (ref 70–99)
Potassium: 4.7 mmol/L (ref 3.5–5.1)
Sodium: 137 mmol/L (ref 135–145)

## 2022-06-14 LAB — D-DIMER, QUANTITATIVE: D-Dimer, Quant: <0.27 ug/mL-FEU (ref 0.00–0.50)

## 2022-06-14 LAB — TROPONIN I (HIGH SENSITIVITY)
Troponin I (High Sensitivity): 3 ng/L (ref <18)
Troponin I (High Sensitivity): 3 ng/L (ref <18)

## 2022-06-14 LAB — CBG MONITORING, ED: Glucose-Capillary: 131 mg/dL — ABNORMAL HIGH (ref 70–99)

## 2022-06-14 MED ORDER — FAMOTIDINE 20 MG PO TABS
20.0000 mg | ORAL_TABLET | Freq: Once | ORAL | Status: AC
  Administered 2022-06-14: 20 mg via ORAL
  Filled 2022-06-14: qty 1

## 2022-06-14 MED ORDER — ALUM & MAG HYDROXIDE-SIMETH 200-200-20 MG/5ML PO SUSP
30.0000 mL | Freq: Once | ORAL | Status: AC
  Administered 2022-06-14: 30 mL via ORAL
  Filled 2022-06-14: qty 30

## 2022-06-14 MED ORDER — LACTATED RINGERS IV BOLUS
1000.0000 mL | Freq: Once | INTRAVENOUS | Status: AC
  Administered 2022-06-14: 1000 mL via INTRAVENOUS

## 2022-06-14 NOTE — ED Provider Notes (Signed)
[facility] EMERGENCY DEPARTMENT AT [facility] Provider Note   CSN: [id number] Arrival date & time: 06/14/22  1409     History No chief complaint on file.   HPI Tim Richardson is a 52 y.o. male presenting for chief complaint of chest pain. States that he has had a week of intermittent palpitations and chest pain.  He denies fevers chills nausea vomiting syncope shortness of breath. Otherwise ambulatory tolerating p.o. intake.  No known sick contacts. History of CAD status post angiography 16 months ago that demonstrated a 50% blockage. Ultimately manage medically.  Has been chest pain-free until a week ago where he started having these intermittent discomforts.  He does have a substantial history of gastroesophageal reflux disease on daily PPI.  Called his cardiologist who arranged outpatient follow-up in the morning but was told to come here for emergent evaluation.  Patient's recorded medical, surgical, social, medication list and allergies were reviewed in the Snapshot window as part of the initial history.   Review of Systems   Review of Systems  Constitutional:  Negative for chills and fever.  HENT:  Negative for ear pain and sore throat.   Eyes:  Negative for pain and visual disturbance.  Respiratory:  Negative for cough and shortness of breath.   Cardiovascular:  Positive for chest pain and palpitations.  Gastrointestinal:  Positive for nausea. Negative for abdominal pain and vomiting.  Genitourinary:  Negative for dysuria and hematuria.  Musculoskeletal:  Negative for arthralgias and back pain.  Skin:  Negative for color change and rash.  Neurological:  Negative for seizures and syncope.  All other systems reviewed and are negative.   Physical Exam Updated Vital Signs BP 121/73   Pulse 73   Temp 97.7 F (36.5 C) (Oral)   Resp 14   SpO2 98%  Physical Exam Vitals and nursing note reviewed.  Constitutional:      General: He is not in acute distress.     Appearance: He is well-developed.  HENT:     Head: Normocephalic and atraumatic.  Eyes:     Conjunctiva/sclera: Conjunctivae normal.  Cardiovascular:     Rate and Rhythm: Normal rate and regular rhythm.     Heart sounds: No murmur heard. Pulmonary:     Effort: Pulmonary effort is normal. No respiratory distress.     Breath sounds: Normal breath sounds.  Abdominal:     Palpations: Abdomen is soft.     Tenderness: There is no abdominal tenderness.  Musculoskeletal:        General: No swelling.     Cervical back: Neck supple.  Skin:    General: Skin is warm and dry.     Capillary Refill: Capillary refill takes less than 2 seconds.  Neurological:     Mental Status: He is alert.  Psychiatric:        Mood and Affect: Mood normal.      ED Course/ Medical Decision Making/ A&P Clinical Course as of 06/14/22 1909  Tue Jun 14, 2022  1519 Chest pressure, palpitations x 1 weeks Worsening presyncope feeling today this week [CC]    Clinical Course User Index [CC] Glyn Ade, MD    Procedures Procedures   Medications Ordered in ED Medications  lactated ringers bolus 1,000 mL (1,000 mLs Intravenous New Bag/Given 06/14/22 1612)  famotidine (PEPCID) tablet 20 mg (20 mg Oral Given 06/14/22 1609)  alum & mag hydroxide-simeth (MAALOX/MYLANTA) 200-200-20 MG/5ML suspension 30 mL (30 mLs Oral Given 06/14/22 1610)  Medical Decision Making: Tim Richardson is a 52 y.o. male who presented to the ED today with chest pain, detailed above.  Based on patient's comorbidities, patient follows outside the heart pathway because of known coronary obstructive disease.    Additional history discussed with patient's family/caregivers.  Patient placed on continuous vitals and telemetry monitoring while in ED which was reviewed periodically.  Complete initial physical exam performed, notably the patient was hemodynamically stable in no acute distress.  Notably tachycardic initially.   Reviewed and  confirmed nursing documentation for past medical history, family history, social history.    Initial Assessment: With the patient's presentation of left-sided chest pain, most likely diagnosis is musculoskeletal chest pain versus GERD, although ACS remains on the differential. Other diagnoses were considered including (but not limited to) pulmonary embolism, community-acquired pneumonia, aortic dissection, pneumothorax, underlying bony abnormality, anemia. These are considered less likely due to history of present illness and physical exam findings.    In particular, concerning pulmonary embolism: Patient is PERC positive and the they deny malignancy, recent surgery, history of DVT, or calf tenderness leading to a low risk Wells score. Aortic Dissection also reconsidered but seems less likely based on the location, quality, onset, and severity of symptoms in this case. Patient has a lack of serious comorbidities for this condition including a lack of HTN or Smoking. Patient also has a lack of underlying history of AD or TAA.  This is most consistent with an acute life/limb threatening illness complicated by underlying chronic conditions.   Initial Plan: Evaluate for ACS with deltae troponin and EKG evaluated as below  Evaluate for dissection, bony abnormality, or pneumonia with chest x-ray and screening laboratory evaluation including CBC, BMP  Further evaluation for pulmonary embolism indicated at this time based on patient's PERC and Wells score.  Will initiate evaluation with D-dimer with plan to escalate to CTA if positive Further evaluation for Thoracic Aortic Dissection not indicated at this time based on patient's clinical history and PE findings.   Initial Study Results: EKG was reviewed independently. Rate, rhythm, axis, intervals all examined and without medically relevant abnormality. ST segments without concerns for elevations.    Laboratory  Delta troponin demonstrated no acute  abnormalities  CBC and BMP without obvious metabolic or inflammatory abnormalities requiring further evaluation   Radiology  DG Chest 2 View  Result Date: 06/14/2022 CLINICAL DATA:  Chest pain. EXAM: CHEST - 2 VIEW COMPARISON:  Chest radiographs 01/02/2022 and 01/14/2021 FINDINGS: Cardiac silhouette and mediastinal contours are within normal limits. The lungs are clear. No pleural effusion or pneumothorax. No acute skeletal abnormality. IMPRESSION: No active cardiopulmonary disease. Electronically Signed   By: Neita Garnet M.D.   On: 06/14/2022 14:55    Final Assessment and Plan: Reassessed at bedside after 4 and half hours of observation in the emergency room.  Patient has remained asymptomatic in no acute distress. Had a prolonged conversation with patient.  Informed that because of his risk of cardiac disease, I recommended admission to the hospital for immediate evaluation by cardiology.  However patient states that he has arranged for follow-up in the morning and feels passionate about discharge tonight with plan to follow-up with his cardiologist in a.m. Given negative studies at this time I do believe this is reasonable.  Strict return precautions reinforced regarding interval worsening importance of close outpatient follow-up or returning if unable to see provider tomorrow.  Disposition:  I have considered need for hospitalization, however, considering all of the above, I  believe this patient is stable for discharge at this time.  Patient/family educated about specific return precautions for given chief complaint and symptoms.  Patient/family educated about follow-up with PCP and cardiology.     Patient/family expressed understanding of return precautions and need for follow-up. Patient spoken to regarding all imaging and laboratory results and appropriate follow up for these results. All education provided in verbal form with additional information in written form. Time was allowed for  answering of patient questions. Patient discharged.    Emergency Department Medication Summary:   Medications  lactated ringers bolus 1,000 mL (1,000 mLs Intravenous New Bag/Given 06/14/22 1612)  famotidine (PEPCID) tablet 20 mg (20 mg Oral Given 06/14/22 1609)  alum & mag hydroxide-simeth (MAALOX/MYLANTA) 200-200-20 MG/5ML suspension 30 mL (30 mLs Oral Given 06/14/22 1610)                  Clinical Impression:  1. Chest pain, unspecified type      Discharge   Final Clinical Impression(s) / ED Diagnoses Final diagnoses:  Chest pain, unspecified type    Rx / DC Orders ED Discharge Orders     None         Glyn Ade, MD 06/14/22 1909

## 2022-06-14 NOTE — ED Triage Notes (Signed)
Pt arrives via POV with a c/o of dizziness and CP for about 4 days. Pt also states he has abdominal cramps for a week.

## 2022-06-14 NOTE — Telephone Encounter (Signed)
Patient states he has been having chest pain off and for 5 days. This morning he was "shaky", I asked was he diabetic and he states he is and did not check his blood sugar but he ate and the shakiness went away for a couple of hours. He currently does not have chest pain or the shakiness. He denies any shortness or breath, headache or dizziness. He states he been to the ED twice for the same thing and they told him it was his GERD. I did advise he contact PCP for his GERD. He stated he wanted to be seen for his chest pain just to make sure its not heart related and he has not been seen since last spring. He has appointment scheduled for tomorrow with Edd Fabian.

## 2022-06-14 NOTE — Telephone Encounter (Signed)
Pt c/o of Chest Pain: STAT if CP now or developed within 24 hours  1. Are you having CP right now? Yes   2. Are you experiencing any other symptoms (ex. SOB, nausea, vomiting, sweating)? Shaky  3. How long have you been experiencing CP? Last week  4. Is your CP continuous or coming and going? Coming and going  5. Have you taken Nitroglycerin? No  ?

## 2022-06-14 NOTE — Progress Notes (Unsigned)
Cardiology Clinic Note   Patient Name: Tim Richardson Date of Encounter: 06/15/2022  Primary Care Provider:  Marva Panda, NP Primary Cardiologist:  Chrystie Nose, MD  Patient Profile    Tim Richardson 52 year old male presents the clinic today for an evaluation of his chest discomfort  Past Medical History    Past Medical History:  Diagnosis Date   Allergy    Arthritis    Diabetes mellitus without complication (HCC)    GERD (gastroesophageal reflux disease)    Ulcer    Past Surgical History:  Procedure Laterality Date   HERNIA REPAIR     LEFT HEART CATH AND CORONARY ANGIOGRAPHY N/A 02/12/2021   Procedure: LEFT HEART CATH AND CORONARY ANGIOGRAPHY;  Surgeon: Marykay Lex, MD;  Location: [facility] INVASIVE CV LAB;  Service: Cardiovascular;  Laterality: N/A;   SHOULDER SURGERY      Allergies  No Known Allergies  History of Present Illness    Tim Richardson is a PMH of HLD, CAD, type 2 diabetes, and GERD.  He was initially referred to Dr. Rennis Golden for evaluation of his chest pain.  He had been seen in the emergency department 12/22 for left jaw pain.  He also noted some achiness in his left arm and chest discomfort.  His symptoms were worse after eating.  He was given nitroglycerin which did not give relief.  He was treated with Carafate and Prilosec.  He was seen in follow-up 02/11/2021.  During that time he was having symptoms concerning for angina.  He underwent coronary CTA which showed a coronary calcium score of 574 placing him in the 90th percentile for age and sex matched controls.  He was noted to have proximal mixed plaque which was severe in his RCA.  He was also noted to have mild plaque in his LAD.  His study was not able to have FFR.  Cardiac catheterization was recommended.  He was started on aspirin 81 mg daily.  He was seen in follow-up 03/05/2021.  During that time he reported he was feeling better.  He underwent cardiac cath 02/12/2021.  He was noted to have  moderate two-vessel coronary disease including 45% stenosis of the proximal RCA and 50% mid circumflex stenosis.  His LAD showed minimal coronary disease.  Medical management was recommended.  He was continued on aspirin.  His LDL was down to 66 and his rosuvastatin was continued.  He had lost weight.  He had started Carafate for concern of possible ulcer.  He was scheduled for endoscopy and colonoscopy.    He contacted the nurse triage line on 06/14/2022.  He reported chest discomfort over the last week that would come and go.  He reported feeling shaky.  He presents to the clinic today for follow-up evaluation and states over the last 6 to 8 days he has noticed some dizziness.  He reported that he was at the beach and took some magnesium citrate to clean out his system.  We reviewed his emergency department visit yesterday.  His high-sensitivity troponins were normal and flat.  His lab work was unremarkable.  He may have some slight dehydration that is contributing to his dizziness.  He also reports that his discomfort is alleviated with eating.  We reviewed his prior GI symptoms and issues.  I recommended that he increase his p.o. hydration, use Pepto-Bismol as needed, follow-up with GI if his symptoms do not improve and we plan follow-up for 9 to 12 months.  Home Medications    Prior to Admission medications   Medication Sig Start Date End Date Taking? Authorizing Provider  acetaminophen (TYLENOL) 500 MG tablet Take 1,000 mg by mouth every 6 (six) hours as needed for headache. Patient not taking: Reported on 03/05/2021    [provider]  Amino Acids (AMINO ACID PO) Take by mouth. prn    [provider]  aspirin EC 81 MG tablet Take 81 mg by mouth daily. Swallow whole.    [provider]  Coenzyme Q10 (CO Q 10 PO) Take by mouth.    [provider]  Folic Acid (FOLATE PO) Take by mouth.    [provider]  Garlic 1000 MG CAPS Take 1,000 mg by mouth  daily.    [provider]  ibuprofen (ADVIL,MOTRIN) 200 MG tablet Take 800 mg by mouth every 6 (six) hours as needed for mild pain or moderate pain. Patient not taking: Reported on 03/05/2021    [provider]  metFORMIN (GLUCOPHAGE) 500 MG tablet Take 500 mg by mouth 2 (two) times daily. 01/06/21   [provider]  NON FORMULARY Take 500 mg by mouth daily.    [provider]  Omega-3 1000 MG CAPS Take 1,000 mg by mouth daily.    [provider]  pantoprazole (PROTONIX) 40 MG tablet Take 1 tablet (40 mg total) by mouth 2 (two) times daily. 01/02/22   Derwood Kaplan, MD  rosuvastatin (CRESTOR) 20 MG tablet Take 1 tablet (20 mg total) by mouth daily. 03/05/21   Hilty, Lisette Abu, MD  simethicone (MYLICON) 80 MG chewable tablet Chew 80 mg by mouth every 6 (six) hours as needed for flatulence. Patient not taking: Reported on 03/25/2022    [provider]  sucralfate (CARAFATE) 1 g tablet Take 1 tablet (1 g total) by mouth 4 (four) times daily -  with meals and at bedtime. 01/02/22   Derwood Kaplan, MD  vitamin B-12 (CYANOCOBALAMIN) 100 MCG tablet Take 100 mcg by mouth daily.    [provider]  Vitamin D3 (VITAMIN D) 25 MCG tablet Take 1,000 Units by mouth daily.    [provider]  zinc gluconate 50 MG tablet Take 50 mg by mouth daily.    [provider]    Family History    Family History  Problem Relation Age of Onset   Diabetes Mother    Heart disease Mother    Stroke Mother    Heart disease Father    Hyperlipidemia Father    Heart disease Sister    Mental retardation Sister    He indicated that his mother is alive. He indicated that his father is alive. He indicated that both of his sisters are alive.  Social History    Social History   Socioeconomic History   Marital status: Married    Spouse name: Not on file   Number of children: Not on file   Years of education: Not on file   Highest education  level: Not on file  Occupational History   Not on file  Tobacco Use   Smoking status: Former   Smokeless tobacco: Current    Types: Snuff   Tobacco comments:    Dip  Vaping Use   Vaping Use: Never used  Substance and Sexual Activity   Alcohol use: No   Drug use: No   Sexual activity: Not on file  Other Topics Concern   Not on file  Social History Narrative   Right  Handed    Lives in a two story home. Lives with wife, daughter and family.    Caffeine one or two a week   Education administrator   Social Determinants of Health   Financial Resource Strain: Not on file  Food Insecurity: Not on file  Transportation Needs: Not on file  Physical Activity: Not on file  Stress: Not on file  Social Connections: Not on file  Intimate Partner Violence: Not on file     Review of Systems    General:  No chills, fever, night sweats or weight changes.  Cardiovascular:  No chest pain, dyspnea on exertion, edema, orthopnea, palpitations, paroxysmal nocturnal dyspnea. Dermatological: No rash, lesions/masses Respiratory: No cough, dyspnea Urologic: No hematuria, dysuria Abdominal:   No nausea, vomiting, diarrhea, bright red blood per rectum, melena, or hematemesis Neurologic:  No visual changes, wkns, changes in mental status. All other systems reviewed and are otherwise negative except as noted above.  Physical Exam    VS:  BP 128/72 (BP Location: Left Arm, Patient Position: Sitting, Cuff Size: Large)   Pulse 96   Ht 6\' 3"  (1.905 m)   Wt 247 lb (112 kg)   SpO2 98%   BMI 30.87 kg/m  , BMI Body mass index is 30.87 kg/m. GEN: Well nourished, well developed, in no acute distress. HEENT: normal. Neck: Supple, no JVD, carotid bruits, or masses. Cardiac: RRR, no murmurs, rubs, or gallops. No clubbing, cyanosis, edema.  Radials/DP/PT 2+ and equal bilaterally.  Respiratory:  Respirations regular and unlabored, clear to auscultation bilaterally. GI: Soft, nontender, nondistended, BS + x 4. MS: no  deformity or atrophy. Skin: warm and dry, no rash. Neuro:  Strength and sensation are intact. Psych: Normal affect.  Accessory Clinical Findings    Recent Labs: 01/02/2022: ALT 19 06/14/2022: BUN 10; Creatinine, Ser 1.16; Hemoglobin 14.0; Platelets 207; Potassium 4.7; Sodium 137   Recent Lipid Panel No results found for: "CHOL", "TRIG", "HDL", "CHOLHDL", "VLDL", "LDLCALC", "LDLDIRECT"       ECG personally reviewed by me today-none today.   Cardiac catheterization 02/12/2021    Prox RCA lesion is 45% stenosed.   Mid Cx lesion is 50% stenosed.   The left ventricular systolic function is normal.  The left ventricular ejection fraction is 50-55% by visual estimate.   LV end diastolic pressure is normal.   There is no aortic valve stenosis.   SUMMARY Moderate two-vessel disease of the proximal RCA (45%) and mid LCx (50%) Normal LAD with minimal disease. Normal LV function with normal EDP.     RECOMMENDATIONS Consider nonanginal cause for chest pain. With moderate CAD, recommend aggressive risk factor modification with glycemic, blood pressure and lipid control. Agree with 81 mg aspirin.     Follow-up with Dr. Rennis Golden.     Bryan Lemma, MD  Diagnostic Dominance: Right  Intervention   Assessment & Plan   1.  Chest discomfort-intermittent over the last several days.  Relieved with eating.  Atypical and appears to be secondary to gastrointestinal causes.  May use Pepto-Bismol as needed Continue simethicone, Carafate, Protonix Follow-up with PCP/GI  Dizziness-started after using magnesium citrate.  Recent lab work unremarkable. Increase p.o. hydration Change positions slowly  Coronary artery disease-denies chest pain today.  Underwent cardiac catheterization which showed nonobstructive CAD.  Medical management was recommended. Continue rosuvastatin, co-Q10, omega-3 fatty acids Salty 6 diet sheet given  Hyperlipidemia-follows with PCP.  Reports that his  triglycerides were elevated and he is no longer taking fish oil. Continue rosuvastatin, co-Q10,  Resume omega-3 fatty acids High-fiber diet Increase physical activity as tolerated  Disposition: Follow-up with Dr. Rennis Golden or me in 9-12 months.   Tim Richardson     06/15/2022, 12:11 PM [facility] [address] Office [phone] Fax [phone]    I spent 14 minutes examining this patient, reviewing medications, and using patient centered shared decision making involving her cardiac care.  Prior to her visit I spent greater than 20 minutes reviewing her past medical history,  medications, and prior cardiac tests.

## 2022-06-15 ENCOUNTER — Encounter: Payer: Self-pay | Admitting: General Practice

## 2022-06-15 ENCOUNTER — Ambulatory Visit: Payer: BC Managed Care – PPO | Attending: General Practice | Admitting: General Practice

## 2022-06-15 VITALS — BP 128/72 | HR 96 | Ht 75.0 in | Wt 247.0 lb

## 2022-06-15 DIAGNOSIS — E785 Hyperlipidemia, unspecified: Secondary | ICD-10-CM | POA: Diagnosis not present

## 2022-06-15 DIAGNOSIS — I2510 Atherosclerotic heart disease of native coronary artery without angina pectoris: Secondary | ICD-10-CM

## 2022-06-15 DIAGNOSIS — R079 Chest pain, unspecified: Secondary | ICD-10-CM

## 2022-06-15 NOTE — Patient Instructions (Addendum)
Medication Instructions:  Can use Pepto Bismol for GI symptom Re-start Fish oil 1000 mg daily  *If you need a refill on your cardiac medications before your next appointment, please call your pharmacy*   Lab Work: NONE ordered at this time of appointment   If you have labs (blood work) drawn today and your tests are completely normal, you will receive your results only by: MyChart Message (if you have MyChart) OR A paper copy in the mail If you have any lab test that is abnormal or we need to change your treatment, we will call you to review the results.   Testing/Procedures: NONE ordered at this time of appointment      Follow-Up: At [facility], you and your health needs are our priority.  As part of our continuing mission to provide you with exceptional heart care, we have created designated Provider Care Teams.  These Care Teams include your primary Cardiologist (physician) and Advanced Practice Providers (APPs -  Physician Assistants and Nurse Practitioners) who all work together to provide you with the care you need, when you need it.  We recommend signing up for the patient portal called "MyChart".  Sign up information is provided on this After Visit Summary.  MyChart is used to connect with patients for Virtual Visits (Telemedicine).  Patients are able to view lab/test results, encounter notes, upcoming appointments, etc.  Non-urgent messages can be sent to your provider as well.   To learn more about what you can do with MyChart, go to [email or web address].    Your next appointment:   9 month(s)  Provider:   Chrystie Nose, MD     Other Instructions Increase physical activity as tolerated. Keep hydrated as directed.

## 2022-08-29 ENCOUNTER — Telehealth: Payer: Self-pay | Admitting: Internal Medicine

## 2022-08-29 MED ORDER — ROSUVASTATIN CALCIUM 20 MG PO TABS: 20.0000 mg | ORAL_TABLET | Freq: Every day | ORAL | 2 refills | Status: AC

## 2022-08-29 NOTE — Telephone Encounter (Signed)
*  STAT* If patient is at the pharmacy, call can be transferred to refill team.   1. Which medications need to be refilled? (please list name of each medication and dose if known)  rosuvastatin (CRESTOR) 20 MG tablet  2. Which pharmacy/location (including street and city if local pharmacy) is medication to be sent to?  [place], [place] - [id number]  3. Do they need a 30 day or 90 day supply?  90 day supply

## 2022-08-29 NOTE — Telephone Encounter (Signed)
Pt's medication was sent to pt's pharmacy as requested. Confirmation received.  °

## 2022-10-03 DIAGNOSIS — E119 Type 2 diabetes mellitus without complications: Secondary | ICD-10-CM | POA: Diagnosis not present

## 2022-10-03 DIAGNOSIS — K224 Dyskinesia of esophagus: Secondary | ICD-10-CM | POA: Diagnosis not present

## 2022-10-03 DIAGNOSIS — E785 Hyperlipidemia, unspecified: Secondary | ICD-10-CM | POA: Diagnosis not present

## 2022-12-23 DIAGNOSIS — R232 Flushing: Secondary | ICD-10-CM | POA: Diagnosis not present

## 2022-12-23 DIAGNOSIS — I10 Essential (primary) hypertension: Secondary | ICD-10-CM | POA: Diagnosis not present

## 2022-12-30 ENCOUNTER — Ambulatory Visit: Payer: BC Managed Care – PPO | Admitting: Cardiology

## 2022-12-30 ENCOUNTER — Ambulatory Visit: Payer: BC Managed Care – PPO | Attending: Nurse Practitioner | Admitting: Nurse Practitioner

## 2022-12-30 VITALS — BP 128/82 | HR 75 | Ht 75.0 in | Wt 257.0 lb

## 2022-12-30 DIAGNOSIS — R079 Chest pain, unspecified: Secondary | ICD-10-CM | POA: Diagnosis not present

## 2022-12-30 DIAGNOSIS — E785 Hyperlipidemia, unspecified: Secondary | ICD-10-CM | POA: Diagnosis not present

## 2022-12-30 DIAGNOSIS — E119 Type 2 diabetes mellitus without complications: Secondary | ICD-10-CM

## 2022-12-30 DIAGNOSIS — I2510 Atherosclerotic heart disease of native coronary artery without angina pectoris: Secondary | ICD-10-CM

## 2022-12-30 DIAGNOSIS — I10 Essential (primary) hypertension: Secondary | ICD-10-CM

## 2022-12-30 DIAGNOSIS — K219 Gastro-esophageal reflux disease without esophagitis: Secondary | ICD-10-CM | POA: Diagnosis not present

## 2022-12-30 MED ORDER — AMLODIPINE BESYLATE 2.5 MG PO TABS: 2.5000 mg | ORAL_TABLET | Freq: Every day | ORAL | 3 refills | Status: DC

## 2022-12-30 NOTE — Progress Notes (Unsigned)
Office Visit    Patient Name: Tim Richardson Date of Encounter: 12/30/2022 Primary Care Provider:  Elie Confer, NP Primary Cardiologist:  Chrystie Nose, MD  Chief Complaint    52 year old male with a history of nonobstructive CAD, hyperlipidemia, type 2 diabetes, and GERD who presents for follow-up related to CAD and hypertension.   Past Medical History    Past Medical History:  Diagnosis Date   Allergy    Arthritis    Diabetes mellitus without complication (HCC)    GERD (gastroesophageal reflux disease)    Ulcer    Past Surgical History:  Procedure Laterality Date   HERNIA REPAIR     LEFT HEART CATH AND CORONARY ANGIOGRAPHY N/A 02/12/2021   Procedure: LEFT HEART CATH AND CORONARY ANGIOGRAPHY;  Surgeon: Marykay Lex, MD;  Location: [facility] INVASIVE CV LAB;  Service: Cardiovascular;  Laterality: N/A;   SHOULDER SURGERY      Allergies  No Known Allergies   Labs/Other Studies Reviewed    The following studies were reviewed today:  Cardiac Studies & Procedures   CARDIAC CATHETERIZATION  CARDIAC CATHETERIZATION 02/12/2021  Narrative   Prox RCA lesion is 45% stenosed.   Mid Cx lesion is 50% stenosed.   The left ventricular systolic function is normal.  The left ventricular ejection fraction is 50-55% by visual estimate.   LV end diastolic pressure is normal.   There is no aortic valve stenosis.  SUMMARY Moderate two-vessel disease of the proximal RCA (45%) and mid LCx (50%) Normal LAD with minimal disease. Normal LV function with normal EDP.   RECOMMENDATIONS Consider nonanginal cause for chest pain. With moderate CAD, recommend aggressive risk factor modification with glycemic, blood pressure and lipid control. Agree with 81 mg aspirin.   Follow-up with Dr. Rennis Golden.   Bryan Lemma, MD  Findings Coronary Findings Diagnostic  Dominance: Right  Left Main Vessel was injected. Vessel is normal in caliber.  Left Anterior Descending Vessel is  normal in caliber. The vessel exhibits minimal luminal irregularities.  First Diagonal Branch Vessel is small in size.  First Septal Branch Vessel is small in size.  Third Diagonal Branch Vessel is small in size.  Left Circumflex Mid Cx lesion is 50% stenosed. The lesion is focal and eccentric.  First Obtuse Marginal Branch Vessel is small in size.  Left Posterior Atrioventricular Artery Vessel is small in size.  Right Coronary Artery Vessel was injected. Vessel is large. Prox RCA lesion is 45% stenosed. The lesion is segmental, eccentric and smooth. The lesion is mildly calcified.  Acute Marginal Branch Vessel is small in size.  Right Ventricular Branch Vessel is small in size.  Right Posterior Atrioventricular Artery Vessel is moderate in size.  Intervention  No interventions have been documented.        CT SCANS  CT CORONARY MORPH W/CTA COR W/SCORE 02/01/2021  Addendum 02/02/2021  1:15 PM ADDENDUM REPORT: 02/02/2021 13:12  CLINICAL DATA:  63M with chest pain.  EXAM: Cardiac/Coronary  CT  TECHNIQUE: The patient was scanned on a [employer].  FINDINGS: A 120 kV prospective scan was triggered in the descending thoracic aorta at 111 HU's. Axial non-contrast 3 mm slices were carried out through the heart. The data set was analyzed on a dedicated work station and scored using the Agatson method. Gantry rotation speed was 250 msecs and collimation was .6 mm. No beta blockade and 0.8 mg of sl NTG was given. The 3D data set was reconstructed in 5% intervals of  the 67-82 % of the R-R cycle. Diastolic phases were analyzed on a dedicated work station using MPR, MIP and VRT modes. The patient received 80 cc of contrast.  Aorta: Normal size.  No calcifications.  No dissection.  Aortic Valve:  Trileaflet.  No calcifications.  Coronary Arteries:  Normal coronary origin.  Right dominance.  RCA is a large dominant artery that gives rise to PDA and  PLVB. There is severe (>70%) mixed plaque proximally with minimal (<25%) calcified plaque in the mid and distal RCA and PDA.  Left main is a large artery that gives rise to LAD and LCX arteries. There is minimal (<25%) calcified plaque proximally, mild (25-49%) calcified plaque in the mid LAD and minimal plaque distally.  LAD is a large vessel that has minimal (<25%)  plaque.  LCX is a non-dominant artery that gives rise to one large OM1 branch. There is no plaque.  Coronary Calcium Score:  Left main: 0  Left anterior descending artery: 349  Left circumflex artery: 2.82  Right coronary artery: 222  Total: 574  Percentile: 98th  Other findings:  Normal pulmonary vein drainage into the left atrium.  Normal let atrial appendage without a thrombus.  Normal size of the pulmonary artery.  IMPRESSION: 1. Coronary calcium score of 574. This was 98th percentile for age-, race-, and sex-matched controls.  2. Normal coronary origin with right dominance.  3. Severe (>70%) mixed plaque in the proximal RCA. Mild (25-49%) calcified plaque in the mid LAD. CAD-RADS 4.  4.  This study cannot be sent to [facility] for FFR due to artifact.  5.  Recommend cardiac catheterization.  Chilton Si, MD   Electronically Signed By: Chilton Si M.D. On: 02/02/2021 13:12  Narrative EXAM: OVER-READ INTERPRETATION  CT CHEST  The following report is an over-read performed by radiologist Dr. Leanna Battles of [facility] on 02/01/2021. This over-read does not include interpretation of cardiac or coronary anatomy or pathology. The coronary calcium score/coronary CTA interpretation by the cardiologist is attached.  COMPARISON:  None.  FINDINGS: Vascular: None.  Mediastinum/Nodes: None.  Lungs/Pleura: Minimal subsegmental volume loss in the lingula and left lower lobe. No pleural fluid.  Upper Abdomen: Liver is decreased in attenuation.  Musculoskeletal:  Mild degenerative changes in the spine.  IMPRESSION: No acute extracardiac findings.  Electronically Signed: By: Leanna Battles M.D. On: 02/01/2021 16:56         Recent Labs: 01/02/2022: ALT 19 06/14/2022: BUN 10; Creatinine, Ser 1.16; Hemoglobin 14.0; Platelets 207; Potassium 4.7; Sodium 137  Recent Lipid Panel No results found for: "CHOL", "TRIG", "HDL", "CHOLHDL", "VLDL", "LDLCALC", "LDLDIRECT"  History of Present Illness    52 year old male with the above past medical history including nonobstructive CAD, hyperlipidemia, type 2 diabetes, and GERD.  He has a history of intermittent chest pain.  Coronary CT angiogram in 01/2021 showed coronary calcium score of 574, 90th percentile, severe mixed plaque in the proximal RCA.  Follow-up cardiac catheterization in 01/2021 revealed 45% proximal RCA stenosis, 50% mid LCx stenosis, EF 50 to 55%.  He was last seen in the office on 06/15/2022 and was stable overall from a cardiac standpoint though he did note some intermittent dizziness.  He was seen in the ED prior to his visit, it was felt that dehydration was likely contributing to his dizziness. He noted atypical chest discomfort, relieved with eating, this was felt to be secondary to GI source.   He presents today for follow-up.  Since his last visit  he has been stable overall from a cardiac standpoint.  He feels that much of his chest discomfort is related to GERD.  He has been taking alprazolam for esophageal spasms with improvement in his symptoms.  He will still note an occasional burning sensation in his mid epigastric region, and a "prickling "sensation that radiates across his chest.  He was evaluated by two GI specialists, he was initially told he had Barrett's esophagus, but was later told this was not the case.  Per patient, he was ultimately diagnosed with gastritis.  He denies any exertional symptoms concerning for angina.  Over the past several months he has noted some elevated blood  pressure in the evenings with a flushed sensation in his neck and head.  Otherwise, he reports feeling well.  Home Medications    Current Outpatient Medications  Medication Sig Dispense Refill   acetaminophen (TYLENOL) 500 MG tablet Take 1,000 mg by mouth every 6 (six) hours as needed for headache.     ALPRAZolam (XANAX) 0.5 MG tablet Take 0.5 mg by mouth 2 (two) times daily.     Amino Acids (AMINO ACID PO) Take by mouth. prn     amLODipine (NORVASC) 2.5 MG tablet Take 1 tablet (2.5 mg total) by mouth daily. 90 tablet 3   aspirin EC 81 MG tablet Take 81 mg by mouth daily. Swallow whole.     Coenzyme Q10 (CO Q 10 PO) Take by mouth.     Folic Acid (FOLATE PO) Take by mouth.     Garlic 1000 MG CAPS Take 1,000 mg by mouth daily.     ibuprofen (ADVIL,MOTRIN) 200 MG tablet Take 800 mg by mouth every 6 (six) hours as needed for mild pain or moderate pain.     metFORMIN (GLUCOPHAGE) 500 MG tablet Take 500 mg by mouth 2 (two) times daily.     NON FORMULARY Take 500 mg by mouth daily.     Omega-3 1000 MG CAPS Take 1,000 mg by mouth daily.     pantoprazole (PROTONIX) 40 MG tablet Take 1 tablet (40 mg total) by mouth 2 (two) times daily. 60 tablet 0   rosuvastatin (CRESTOR) 20 MG tablet Take 1 tablet (20 mg total) by mouth daily. 90 tablet 2   simethicone (MYLICON) 80 MG chewable tablet Chew 80 mg by mouth every 6 (six) hours as needed for flatulence.     sucralfate (CARAFATE) 1 g tablet Take 1 tablet (1 g total) by mouth 4 (four) times daily -  with meals and at bedtime. 90 tablet 0   vitamin B-12 (CYANOCOBALAMIN) 100 MCG tablet Take 100 mcg by mouth daily.     Vitamin D3 (VITAMIN D) 25 MCG tablet Take 1,000 Units by mouth daily.     zinc gluconate 50 MG tablet Take 50 mg by mouth daily.     No current facility-administered medications for this visit.     Review of Systems    He denies palpitations, dyspnea, pnd, orthopnea, n, v, dizziness, syncope, edema, weight gain, or early satiety. All other  systems reviewed and are otherwise negative except as noted above.   Physical Exam    VS:  BP 128/82 (BP Location: Left Arm, Patient Position: Sitting, Cuff Size: Normal)   Pulse 75   Ht 6\' 3"  (1.905 m)   Wt 257 lb (116.6 kg)   SpO2 97%   BMI 32.12 kg/m  GEN: Well nourished, well developed, in no acute distress. HEENT: normal. Neck: Supple, no JVD, carotid bruits,  or masses. Cardiac: RRR, no murmurs, rubs, or gallops. No clubbing, cyanosis, edema.  Radials/DP/PT 2+ and equal bilaterally.  Respiratory:  Respirations regular and unlabored, clear to auscultation bilaterally. GI: Soft, nontender, nondistended, BS + x 4. MS: no deformity or atrophy. Skin: warm and dry, no rash. Neuro:  Strength and sensation are intact. Psych: Normal affect.  Accessory Clinical Findings    ECG personally reviewed by me today - EKG Interpretation Date/Time:  Friday December 30 2022 15:19:26 EST Ventricular Rate:  75 PR Interval:  188 QRS Duration:  100 QT Interval:  392 QTC Calculation: 437 R Axis:   77  Text Interpretation: Normal sinus rhythm Normal ECG When compared with ECG of 14-Jun-2022 13:51, No significant change was found Confirmed by Bernadene Person ([id number]) on 12/30/2022 3:38:01 PM  - no acute changes.   Lab Results  Component Value Date   WBC 8.1 06/14/2022   HGB 14.0 06/14/2022   HCT 41.3 06/14/2022   MCV 82.9 06/14/2022   PLT 207 06/14/2022   Lab Results  Component Value Date   CREATININE 1.16 06/14/2022   BUN 10 06/14/2022   NA 137 06/14/2022   K 4.7 06/14/2022   CL 101 06/14/2022   CO2 25 06/14/2022   Lab Results  Component Value Date   ALT 19 01/02/2022   AST 18 01/02/2022   ALKPHOS 45 01/02/2022   BILITOT 0.4 01/02/2022   No results found for: "CHOL", "HDL", "LDLCALC", "LDLDIRECT", "TRIG", "CHOLHDL"  No results found for: "HGBA1C"  Assessment & Plan    1. Nonobstructive CAD/chest pain: Coronary CT angiogram in 01/2021 showed coronary calcium score of 574, 90th  percentile, severe mixed plaque in the proximal RCA.  Follow-up cardiac catheterization in 01/2021 revealed 45% proximal RCA stenosis, 50% mid LCx stenosis, EF 50 to 55%.  He has atypical chest pain, likely related to esophageal spasm/GERD.  Symptoms have improved dramatically with addition of alprazolam.  He denies any exertional symptoms concerning for angina.  No indication for ischemic evaluation at this time.  Will start amlodipine as below.  Continue aspirin, Crestor.  2. Hypertension: Over the past several months he has noted elevated blood pressure in the evenings with a flushed sensation in his neck and head. Will trial amlodipine 2.5 mg to take each evening.  Continue to monitor BP.   3. Hyperlipidemia: No recent lipid profile on file.  He notes he had labs drawn recently through his PCP.  Will request a copy.  Continue Crestor.  4. Type 2 diabetes: No recent A1c on file.  Recommend follow-up with PCP.  5. GERD: Follows with GI.  Improved with addition of alprazolam.  6. Disposition: Follow-up in 6 weeks with APP, follow-up with Dr. Rennis Golden in 04/2023.      Joylene Grapes, NP 12/31/2022, 12:49 PM

## 2022-12-30 NOTE — Patient Instructions (Signed)
Medication Instructions:  Start Amlodipine 2.5 mg take 1 tablet in the evening.   *If you need a refill on your cardiac medications before your next appointment, please call your pharmacy*   Lab Work: NONE ordered at this time of appointment    Testing/Procedures: NONE ordered at this time of appointment     Follow-Up: At [facility], you and your health needs are our priority.  As part of our continuing mission to provide you with exceptional heart care, we have created designated Provider Care Teams.  These Care Teams include your primary Cardiologist (physician) and Advanced Practice Providers (APPs -  Physician Assistants and Nurse Practitioners) who all work together to provide you with the care you need, when you need it.  We recommend signing up for the patient portal called "MyChart".  Sign up information is provided on this After Visit Summary.  MyChart is used to connect with patients for Virtual Visits (Telemedicine).  Patients are able to view lab/test results, encounter notes, upcoming appointments, etc.  Non-urgent messages can be sent to your provider as well.   To learn more about what you can do with MyChart, go to [email or web address].    Your next appointment:    6 weeks Bernadene Person NP) April 2025 Dr. Rennis Golden  Provider:   Chrystie Nose, MD  or Bernadene Person, NP        Other Instructions

## 2022-12-31 ENCOUNTER — Encounter: Payer: Self-pay | Admitting: Nurse Practitioner

## 2023-02-06 ENCOUNTER — Ambulatory Visit: Payer: BC Managed Care – PPO | Admitting: Nurse Practitioner

## 2023-02-27 ENCOUNTER — Encounter: Payer: Self-pay | Admitting: Nurse Practitioner

## 2023-02-27 ENCOUNTER — Ambulatory Visit: Payer: BC Managed Care – PPO | Attending: Nurse Practitioner | Admitting: Nurse Practitioner

## 2023-02-27 VITALS — BP 122/82 | HR 85 | Ht 75.0 in | Wt 261.0 lb

## 2023-02-27 DIAGNOSIS — K219 Gastro-esophageal reflux disease without esophagitis: Secondary | ICD-10-CM

## 2023-02-27 DIAGNOSIS — R079 Chest pain, unspecified: Secondary | ICD-10-CM

## 2023-02-27 DIAGNOSIS — I2510 Atherosclerotic heart disease of native coronary artery without angina pectoris: Secondary | ICD-10-CM | POA: Diagnosis not present

## 2023-02-27 DIAGNOSIS — E785 Hyperlipidemia, unspecified: Secondary | ICD-10-CM

## 2023-02-27 DIAGNOSIS — I10 Essential (primary) hypertension: Secondary | ICD-10-CM | POA: Diagnosis not present

## 2023-02-27 DIAGNOSIS — E119 Type 2 diabetes mellitus without complications: Secondary | ICD-10-CM

## 2023-02-27 NOTE — Patient Instructions (Signed)
Medication Instructions:  Stop Amlodipine as directed.  *If you need a refill on your cardiac medications before your next appointment, please call your pharmacy*   Lab Work: NONE ordered at this time of appointment    Testing/Procedures:    Please report to Radiology at the [facility] Main Entrance 30 minutes early for your test.  [address] [place], [place] [address]                         OR   Please report to Radiology at [facility] Main Entrance, medical mall, 30 mins prior to your test.  [address]  [place], [place]  How to Prepare for Your Cardiac PET/CT Stress Test:  Nothing to eat or drink, except water, 3 hours prior to arrival time.  NO caffeine/decaffeinated products, or chocolate 12 hours prior to arrival. (Please note decaffeinated beverages (teas/coffees) still contain caffeine).  If you have caffeine within 12 hours prior, the test will need to be rescheduled.  Medication instructions: Do not take erectile dysfunction medications for 72 hours prior to test (sildenafil, tadalafil) Do not take nitrates (isosorbide mononitrate, Ranexa) the day before or day of test Do not take tamsulosin the day before or morning of test Hold theophylline containing medications for 12 hours. Hold Dipyridamole 48 hours prior to the test.  Diabetic Preparation: If able to eat breakfast prior to 3 hour fasting, you may take all medications, including your insulin. Do not worry if you miss your breakfast dose of insulin - start at your next meal. If you do not eat prior to 3 hour fast-Hold all diabetes (oral and insulin) medications. Patients who wear a continuous glucose monitor MUST remove the device prior to scanning.  You may take your remaining medications with water.  NO perfume, cologne or lotion on chest or abdomen area. FEMALES - Please avoid wearing dresses to this appointment.  Total time is 1 to 2 hours; you may want  to bring reading material for the waiting time.  IF YOU THINK YOU MAY BE PREGNANT, OR ARE NURSING PLEASE INFORM THE TECHNOLOGIST.  In preparation for your appointment, medication and supplies will be purchased.  Appointment availability is limited, so if you need to cancel or reschedule, please call the Radiology Department Scheduler at [phone] 24 hours in advance to avoid a cancellation fee of $100.00  What to Expect When you Arrive:  Once you arrive and check in for your appointment, you will be taken to a preparation room within the Radiology Department.  A technologist or Nurse will obtain your medical history, verify that you are correctly prepped for the exam, and explain the procedure.  Afterwards, an IV will be started in your arm and electrodes will be placed on your skin for EKG monitoring during the stress portion of the exam. Then you will be escorted to the PET/CT scanner.  There, staff will get you positioned on the scanner and obtain a blood pressure and EKG.  During the exam, you will continue to be connected to the EKG and blood pressure machines.  A small, safe amount of a radioactive tracer will be injected in your IV to obtain a series of pictures of your heart along with an injection of a stress agent.    After your Exam:  It is recommended that you eat a meal and drink a caffeinated beverage to counter act any effects of the stress agent.  Drink plenty of fluids for the remainder of the day and urinate frequently for the first couple of hours after the exam.  Your doctor will inform you of your test results within 7-10 business days.  For more information and frequently asked questions, please visit our website: [email or web address]  For questions about your test or how to prepare for your test, please call: Cardiac Imaging Nurse Navigators Office: [phone]    Follow-Up: At [facility], you and your health needs are our priority.  As part  of our continuing mission to provide you with exceptional heart care, we have created designated Provider Care Teams.  These Care Teams include your primary Cardiologist (physician) and Advanced Practice Providers (APPs -  Physician Assistants and Nurse Practitioners) who all work together to provide you with the care you need, when you need it.  We recommend signing up for the patient portal called "MyChart".  Sign up information is provided on this After Visit Summary.  MyChart is used to connect with patients for Virtual Visits (Telemedicine).  Patients are able to view lab/test results, encounter notes, upcoming appointments, etc.  Non-urgent messages can be sent to your provider as well.   To learn more about what you can do with MyChart, go to [email or web address].    Your next appointment:   6-8 week(s)  Provider:   Bernadene Person, NP        Other Instructions Monitor blood pressure. Goal BP is less than 130/80. Omron cuff recommended. Please bring your log and cuff to your next appointment.

## 2023-02-27 NOTE — Progress Notes (Signed)
Office Visit    Patient Name: Tim Richardson Date of Encounter: 02/27/2023  Primary Care Provider:  Elie Confer, NP Primary Cardiologist:  Chrystie Nose, MD  Chief Complaint    53 year old male with a history of nonobstructive CAD, hyperlipidemia, type 2 diabetes, and GERD who presents for follow-up related to CAD and hypertension.   Past Medical History    Past Medical History:  Diagnosis Date   Allergy    Arthritis    Diabetes mellitus without complication (HCC)    GERD (gastroesophageal reflux disease)    Ulcer    Past Surgical History:  Procedure Laterality Date   HERNIA REPAIR     LEFT HEART CATH AND CORONARY ANGIOGRAPHY N/A 02/12/2021   Procedure: LEFT HEART CATH AND CORONARY ANGIOGRAPHY;  Surgeon: Marykay Lex, MD;  Location: [facility] INVASIVE CV LAB;  Service: Cardiovascular;  Laterality: N/A;   SHOULDER SURGERY      Allergies  No Known Allergies   Labs/Other Studies Reviewed    The following studies were reviewed today:  Cardiac Studies & Procedures   CARDIAC CATHETERIZATION  CARDIAC CATHETERIZATION 02/12/2021  Narrative   Prox RCA lesion is 45% stenosed.   Mid Cx lesion is 50% stenosed.   The left ventricular systolic function is normal.  The left ventricular ejection fraction is 50-55% by visual estimate.   LV end diastolic pressure is normal.   There is no aortic valve stenosis.  SUMMARY Moderate two-vessel disease of the proximal RCA (45%) and mid LCx (50%) Normal LAD with minimal disease. Normal LV function with normal EDP.   RECOMMENDATIONS Consider nonanginal cause for chest pain. With moderate CAD, recommend aggressive risk factor modification with glycemic, blood pressure and lipid control. Agree with 81 mg aspirin.   Follow-up with Dr. Rennis Golden.   Bryan Lemma, MD  Findings Coronary Findings Diagnostic  Dominance: Right  Left Main Vessel was injected. Vessel is normal in caliber.  Left Anterior Descending Vessel is  normal in caliber. The vessel exhibits minimal luminal irregularities.  First Diagonal Branch Vessel is small in size.  First Septal Branch Vessel is small in size.  Third Diagonal Branch Vessel is small in size.  Left Circumflex Mid Cx lesion is 50% stenosed. The lesion is focal and eccentric.  First Obtuse Marginal Branch Vessel is small in size.  Left Posterior Atrioventricular Artery Vessel is small in size.  Right Coronary Artery Vessel was injected. Vessel is large. Prox RCA lesion is 45% stenosed. The lesion is segmental, eccentric and smooth. The lesion is mildly calcified.  Acute Marginal Branch Vessel is small in size.  Right Ventricular Branch Vessel is small in size.  Right Posterior Atrioventricular Artery Vessel is moderate in size.  Intervention  No interventions have been documented.       CT SCANS  CT CORONARY MORPH W/CTA COR W/SCORE 02/01/2021  Addendum 02/02/2021  1:15 PM ADDENDUM REPORT: 02/02/2021 13:12  CLINICAL DATA:  89M with chest pain.  EXAM: Cardiac/Coronary  CT  TECHNIQUE: The patient was scanned on a [employer].  FINDINGS: A 120 kV prospective scan was triggered in the descending thoracic aorta at 111 HU's. Axial non-contrast 3 mm slices were carried out through the heart. The data set was analyzed on a dedicated work station and scored using the Agatson method. Gantry rotation speed was 250 msecs and collimation was .6 mm. No beta blockade and 0.8 mg of sl NTG was given. The 3D data set was reconstructed in 5% intervals of  the 67-82 % of the R-R cycle. Diastolic phases were analyzed on a dedicated work station using MPR, MIP and VRT modes. The patient received 80 cc of contrast.  Aorta: Normal size.  No calcifications.  No dissection.  Aortic Valve:  Trileaflet.  No calcifications.  Coronary Arteries:  Normal coronary origin.  Right dominance.  RCA is a large dominant artery that gives rise to PDA and  PLVB. There is severe (>70%) mixed plaque proximally with minimal (<25%) calcified plaque in the mid and distal RCA and PDA.  Left main is a large artery that gives rise to LAD and LCX arteries. There is minimal (<25%) calcified plaque proximally, mild (25-49%) calcified plaque in the mid LAD and minimal plaque distally.  LAD is a large vessel that has minimal (<25%)  plaque.  LCX is a non-dominant artery that gives rise to one large OM1 branch. There is no plaque.  Coronary Calcium Score:  Left main: 0  Left anterior descending artery: 349  Left circumflex artery: 2.82  Right coronary artery: 222  Total: 574  Percentile: 98th  Other findings:  Normal pulmonary vein drainage into the left atrium.  Normal let atrial appendage without a thrombus.  Normal size of the pulmonary artery.  IMPRESSION: 1. Coronary calcium score of 574. This was 98th percentile for age-, race-, and sex-matched controls.  2. Normal coronary origin with right dominance.  3. Severe (>70%) mixed plaque in the proximal RCA. Mild (25-49%) calcified plaque in the mid LAD. CAD-RADS 4.  4.  This study cannot be sent to [facility] for FFR due to artifact.  5.  Recommend cardiac catheterization.  Chilton Si, MD   Electronically Signed By: Chilton Si M.D. On: 02/02/2021 13:12  Narrative EXAM: OVER-READ INTERPRETATION  CT CHEST  The following report is an over-read performed by radiologist Dr. Leanna Battles of [facility] on 02/01/2021. This over-read does not include interpretation of cardiac or coronary anatomy or pathology. The coronary calcium score/coronary CTA interpretation by the cardiologist is attached.  COMPARISON:  None.  FINDINGS: Vascular: None.  Mediastinum/Nodes: None.  Lungs/Pleura: Minimal subsegmental volume loss in the lingula and left lower lobe. No pleural fluid.  Upper Abdomen: Liver is decreased in attenuation.  Musculoskeletal:  Mild degenerative changes in the spine.  IMPRESSION: No acute extracardiac findings.  Electronically Signed: By: Leanna Battles M.D. On: 02/01/2021 16:56         Recent Labs: 06/14/2022: BUN 10; Creatinine, Ser 1.16; Hemoglobin 14.0; Platelets 207; Potassium 4.7; Sodium 137  Recent Lipid Panel No results found for: "CHOL", "TRIG", "HDL", "CHOLHDL", "VLDL", "LDLCALC", "LDLDIRECT"  History of Present Illness    53 year old male with the above past medical history including nonobstructive CAD, hyperlipidemia, type 2 diabetes, and GERD.   He has a history of intermittent chest pain.  Coronary CT angiogram in 01/2021 showed coronary calcium score of 574, 90th percentile, severe mixed plaque in the proximal RCA.  Follow-up cardiac catheterization in 01/2021 revealed 45% proximal RCA stenosis, 50% mid LCx stenosis, EF 50 to 55%.  At his follow-up visit in 05/2022 he noted intermittent dizziness.  He was seen in the ED prior to his visit, it was felt that dehydration was likely contributing to his dizziness. He noted atypical chest discomfort, relieved with eating, this was felt to be secondary to GI source.  He was last seen in the office on 12/30/2022 and was stable overall from a cardiac endpoint.  He noted intermittent chest discomfort, thought to be related  to GERD.  He reported taking alprazolam for esophageal spasms with improvement in his symptoms.  He noted intermittently elevated BP. He was started on amlodipine 2.5 mg daily.   He presents today for follow-up.  Since his last visit he has been stable overall from a cardiac standpoint though he continues to note intermittently elevated BP as well as an intermittent flushing sensation throughout the day, he notes his face will feel hot.  He notes occasional chest discomfort, not associated with exertion.  He did not have any improvement in his symptoms or improvement in his BP with the addition of amlodipine.  He notes the symptoms have been ongoing  since August 2024 but have recently progressed.  He denies palpitations, dizziness, dyspnea, edema, PND, orthopnea, weight gain.  He is frustrated by his ongoing symptoms.   Home Medications    Current Outpatient Medications  Medication Sig Dispense Refill   acetaminophen (TYLENOL) 500 MG tablet Take 1,000 mg by mouth every 6 (six) hours as needed for headache.     ALPRAZolam (XANAX) 0.5 MG tablet Take 0.5 mg by mouth 2 (two) times daily.     Amino Acids (AMINO ACID PO) Take by mouth. prn     aspirin EC 81 MG tablet Take 81 mg by mouth daily. Swallow whole.     Coenzyme Q10 (CO Q 10 PO) Take by mouth.     Folic Acid (FOLATE PO) Take by mouth.     Garlic 1000 MG CAPS Take 1,000 mg by mouth daily.     ibuprofen (ADVIL,MOTRIN) 200 MG tablet Take 800 mg by mouth every 6 (six) hours as needed for mild pain or moderate pain.     metFORMIN (GLUCOPHAGE) 500 MG tablet Take 500 mg by mouth 2 (two) times daily.     NON FORMULARY Take 500 mg by mouth daily.     Omega-3 1000 MG CAPS Take 1,000 mg by mouth daily.     pantoprazole (PROTONIX) 40 MG tablet Take 1 tablet (40 mg total) by mouth 2 (two) times daily. 60 tablet 0   rosuvastatin (CRESTOR) 20 MG tablet Take 1 tablet (20 mg total) by mouth daily. 90 tablet 2   simethicone (MYLICON) 80 MG chewable tablet Chew 80 mg by mouth every 6 (six) hours as needed for flatulence.     sucralfate (CARAFATE) 1 g tablet Take 1 tablet (1 g total) by mouth 4 (four) times daily -  with meals and at bedtime. 90 tablet 0   vitamin B-12 (CYANOCOBALAMIN) 100 MCG tablet Take 100 mcg by mouth daily.     Vitamin D3 (VITAMIN D) 25 MCG tablet Take 1,000 Units by mouth daily.     zinc gluconate 50 MG tablet Take 50 mg by mouth daily.     No current facility-administered medications for this visit.     Review of Systems    He denies palpitations, dyspnea, pnd, orthopnea, n, v, dizziness, syncope, edema, weight gain, or early satiety. All other systems reviewed and are  otherwise negative except as noted above.   Physical Exam    VS:  BP 122/82   Pulse 85   Ht 6\' 3"  (1.905 m)   Wt 261 lb (118.4 kg)   SpO2 97%   BMI 32.62 kg/m   GEN: Well nourished, well developed, in no acute distress. HEENT: normal. Neck: Supple, no JVD, carotid bruits, or masses. Cardiac: RRR, no murmurs, rubs, or gallops. No clubbing, cyanosis, edema.  Radials/DP/PT 2+ and equal bilaterally.  Respiratory:  Respirations regular and unlabored, clear to auscultation bilaterally. GI: Soft, nontender, nondistended, BS + x 4. MS: no deformity or atrophy. Skin: warm and dry, no rash. Neuro:  Strength and sensation are intact. Psych: Normal affect.  Accessory Clinical Findings    ECG personally reviewed by me today -    - no EKG in office today.    Lab Results  Component Value Date   WBC 8.1 06/14/2022   HGB 14.0 06/14/2022   HCT 41.3 06/14/2022   MCV 82.9 06/14/2022   PLT 207 06/14/2022   Lab Results  Component Value Date   CREATININE 1.16 06/14/2022   BUN 10 06/14/2022   NA 137 06/14/2022   K 4.7 06/14/2022   CL 101 06/14/2022   CO2 25 06/14/2022   Lab Results  Component Value Date   ALT 19 01/02/2022   AST 18 01/02/2022   ALKPHOS 45 01/02/2022   BILITOT 0.4 01/02/2022   No results found for: "CHOL", "HDL", "LDLCALC", "LDLDIRECT", "TRIG", "CHOLHDL"  No results found for: "HGBA1C"  Assessment & Plan    1. Nonobstructive CAD/chest pain: Coronary CT angiogram in 01/2021 showed coronary calcium score of 574, 98th percentile, severe mixed plaque in the proximal RCA.  Follow-up cardiac catheterization in 01/2021 revealed 45% proximal RCA stenosis, 50% mid LCx stenosis, EF 50 to 55%.  He has atypical chest pain, previously thought to be related to esophageal spasm/GERD.  Since August 2024 he has noted increasingly elevated BP, and intermittent flushing sensation in his face which has now become more frequent.  No particular association with exertion.  He did not see  improvement in his symptoms with low-dose amlodipine. He notes he is less active than he used to be in the setting of chronic back pain.  We discussed possible ischemic evaluation, trial of antianginal therapy (Imdur). He is not interested in trial of Imdur as he previously had significant headache with nitroglycerin use.  Through shared decision making, will pursue cardiac PET stress test to rule out ischemia (not a treadmill candidate given chronic back pain). Will discontinue amlodipine given no benefit.  If ischemic workup unrevealing, consider noncardiac cause of symptoms (consider checking TSH, consider statin holiday). Continue aspirin, Crestor.  Informed Consent   Shared Decision Making/Informed Consent The risks [chest pain, shortness of breath, cardiac arrhythmias, dizziness, blood pressure fluctuations, myocardial infarction, stroke/transient ischemic attack, nausea, vomiting, allergic reaction, radiation exposure, metallic taste sensation and life-threatening complications (estimated to be 1 in 10,000)], benefits (risk stratification, diagnosing coronary artery disease, treatment guidance) and alternatives of a nuclear stress test were discussed in detail with Mr. Bosserman and he agrees to proceed.    2. Hypertension: Over the past several months he has noted elevated blood pressure in the evenings (SBP ranging from 130s to 140s), as well as a flushed sensation in his neck and head that is intermittent throughout the day.  No improvement with addition of low-dose amlodipine.  BP is normal in office today.  Doubt flushing sensation is directly correlated to BP changes, will rule out ischemia as above.  If unrevealing, consider statin holiday as below. Continue to monitor BP report because the greater than 130/80.  BP elevated above goal, consider addition of low-dose ARB. Discontinue amlodipine as above.   3. Hyperlipidemia: No recent lipid profile on file.  He notes he had labs drawn recently through  his PCP.  Will request a copy.  He notes daily intermittent flushing sensation as above.  Consider statin holiday if symptoms do not  improve.  Continue Crestor.   4. Type 2 diabetes: No recent A1c on file.  Recommend follow-up with PCP.   5. GERD: Follows with GI.  History of esophageal spasms, improved with addition of alprazolam.   6. Disposition: Follow-up in 6 to 8 weeks.      Joylene Grapes, NP 02/27/2023, 6:36 PM

## 2023-03-30 ENCOUNTER — Telehealth: Payer: Self-pay | Admitting: Internal Medicine

## 2023-03-30 NOTE — Telephone Encounter (Signed)
 Patient identification verified by 2 forms. Tim Rail, RN    Called and spoke to patient  Patient states:   -he has done the Exercise stress test twice before   -would prefer to do the Exercise, not concerned about back pain   -has not heard great things about PET testing  Informed patient message sent to provider regarding order request  Patient has no further questions at this time

## 2023-03-30 NOTE — Telephone Encounter (Signed)
 Patient has a stress test scheduled for 05/23/23, but would like to complete an exercise test instead of the vascular test. Please advise.

## 2023-03-31 NOTE — Telephone Encounter (Signed)
 Joylene Grapes, NP  You24 minutes ago (7:54 AM)    I am not sure that an exercise tolerance test would provide as much information as a PET stress test.  The PET stress test would be a more definitive test to rule out any lack of blood flow to the heart muscle.  Please let me know if he still wishes to cancel his PET stress test.  Thank you-EM   Patient identification verified by 2 forms. Marilynn Rail, RN    Called and spoke to patient  Relayed provider message  Provided Radiology department schedule number  Patient will outreach and schedule at earliest convenience  Patient has no further questions at this time

## 2023-04-21 ENCOUNTER — Ambulatory Visit: Payer: BC Managed Care – PPO | Admitting: Nurse Practitioner

## 2023-05-23 ENCOUNTER — Other Ambulatory Visit (HOSPITAL_COMMUNITY): Payer: BC Managed Care – PPO

## 2023-09-26 NOTE — Progress Notes (Signed)
 Procedure Note - Ear Microscopy, Right:  Cc: 6-day return visit following treatment for right-sided acute OE, requiring otowick placement. Doing better. Wick fell out 3 days ago.  Risks/benefits and alternatives were discussed with patient who understands and agrees to proceed.  DETAILS OF PROCEDURE: The patient was positioned and the ear was examined with the microscope. Right ear canal without edema or erythema. Suctioned out minimal exudate along the anterior canal wall and anterior sulcus. Right TM normal and intact, middle ear aerated.   The patient tolerated this well.  No complications.  Follow up: as needed with ENT. Recommend use of Ciprodex in right ear for 3 more days.

## 2024-01-16 ENCOUNTER — Telehealth: Payer: Self-pay

## 2024-01-16 NOTE — Telephone Encounter (Signed)
"  ° °  Pre-operative Risk Assessment    Patient Name: Tim Richardson  DOB: [birth year] MRN: [id number]   Date of last office visit: 02/27/23 Damien Braver NP Date of next office visit: None   Request for Surgical Clearance    Procedure:  Left Knee Arthroscopy  Date of Surgery:  Clearance 01/23/24                                Surgeon:  Dr. Sheril Surgeon's Group or Practice Name:  [facility] Phone number:  [phone] Fax number:  [phone]   Type of Clearance Requested:   - Medical  - Pharmacy:  Hold Aspirin      Type of Anesthesia:  Choice   Additional requests/questions:    SignedSharlet Hint   01/16/2024, 4:46 PM   "

## 2024-01-17 NOTE — Telephone Encounter (Signed)
" ° ° °  Primary Cardiologist:Kenneth C Hilty, MD  Chart reviewed as part of pre-operative protocol coverage. Because of Tim Richardson's past medical history and time since last visit, he/she will require a follow-up office visit in order to better assess preoperative cardiovascular risk.  Pre-op covering staff: - Please schedule appointment and call patient to inform them. - Please contact requesting surgeon's office via preferred method (i.e, phone, fax) to inform them of need for appointment prior to surgery.  If applicable, this message will also be routed to pharmacy pool and/or primary cardiologist for input on holding anticoagulant/antiplatelet agent as requested below so that this information is available at time of patient's appointment.   Josefa CHRISTELLA Beauvais, NP  01/17/2024, 8:43 AM   "

## 2024-01-17 NOTE — Telephone Encounter (Signed)
 Patient is scheduled for pre-op clearance on 01/19/24 with Dr. Barbaraann.

## 2024-01-18 NOTE — Progress Notes (Unsigned)
 " Cardiology Office Note:  .   Date:  01/19/2024  ID:  Tim Richardson, DOB 1970-07-11, MRN [id number] PCP: Cristopher Suzen HERO, NP  [facility] Providers Cardiologist:  Vinie JAYSON Maxcy, MD   History of Present Illness: .    Chief Complaint  Patient presents with   Pre-op Exam    Tim Richardson is a 53 y.o. male with below history who presents for preop assessment.   History of Present Illness   Tim Richardson is a 53 year old male with coronary artery disease, diabetes, and hyperlipidemia who presents for preoperative assessment.  He is scheduled for a knee arthroscopy. He has no chest pain or trouble breathing and can complete activities greater than 4 METs.  He has a history of coronary artery disease and underwent a cardiac catheterization which showed less significant blockage than initially suspected. He is currently on 40 mg of Crestor  for hyperlipidemia. No chest pain or shortness of breath.  He has diabetes, and his A1c has increased, leading to an adjustment in his metformin dosage to 1000 mg. His blood sugar was 130 mg/dL this morning. No chest pain or trouble breathing.  He experienced elevated blood pressure in the past, attributed to work-related stress and exposure to paint fumes. A trial of a low-dose blood pressure medication resulted in hypotension, and his blood pressure normalized after reducing exposure to the fumes.  He has been unable to exercise due to knee issues, which began after being tripped by a dog. He was previously active, playing men's softball. He can climb stairs but needs to take his time due to knee pain.  He works in chief operating officer on heavy equipment. He does not smoke.           Problem List CAD -45% RCA; 50% LCX 2023 -CAC 574 (98th percentile) 2. HLD 3. DM    ROS: All other ROS reviewed and negative. Pertinent positives noted in the HPI.     Studies Reviewed: SABRA   EKG  Interpretation Date/Time:  Friday January 19 2024 13:12:31 EST Ventricular Rate:  86 PR Interval:  176 QRS Duration:  92 QT Interval:  368 QTC Calculation: 440 R Axis:   63  Text Interpretation: Normal sinus rhythm Normal ECG Confirmed by Barbaraann Kotyk [phone]) on 01/19/2024 1:20:53 PM   Physical Exam:   VS:  BP 110/76 (BP Location: Left Arm, Patient Position: Sitting, Cuff Size: Large)   Pulse 86   Ht 6' 3 (1.905 m)   Wt 259 lb 3.2 oz (117.6 kg)   SpO2 97%   BMI 32.40 kg/m    Wt Readings from Last 3 Encounters:  01/19/24 259 lb 3.2 oz (117.6 kg)  02/27/23 261 lb (118.4 kg)  12/30/22 257 lb (116.6 kg)    GEN: Well nourished, well developed in no acute distress NECK: No JVD; No carotid bruits CARDIAC: RRR, no murmurs, rubs, gallops RESPIRATORY:  Clear to auscultation without rales, wheezing or rhonchi  ABDOMEN: Soft, non-tender, non-distended EXTREMITIES:  No edema; No deformity  ASSESSMENT AND PLAN: .   Assessment and Plan    Preoperative cardiovascular assessment Can complete >4 METS. EKG and physical exam normal. No chest pain or dyspnea. Greater than four METs achieved. - Hold aspirin  prior to surgery to minimize bleeding risk. - He may proceed to surgery at acceptable risk.   Coronary artery disease without angina Nonobstructive coronary artery disease managed with aspirin /statin. No angina or recent myocardial infarction. Cleared  for surgery with aspirin  held. - Hold aspirin  prior to surgery.  Type 2 diabetes mellitus Managed with metformin. Recent A1c increase led to metformin dosage increase to 1000 mg. - Continue metformin 1000 mg daily. - Coordinate diabetes management with primary care provider.  Hyperlipidemia Managed with Crestor  40 mg. Recent CT scan showed no significant coronary artery blockage. - Continue Crestor  40 mg daily.                Follow-up: Return in about 1 year (around 01/18/2025).  Signed, Darryle DASEN. Barbaraann, MD, [facility]  [facility]  [address] [place], [place] [address] [phone]  1:33 PM   "

## 2024-01-19 ENCOUNTER — Encounter: Payer: Self-pay | Admitting: Cardiovascular Disease

## 2024-01-19 ENCOUNTER — Ambulatory Visit: Attending: Cardiovascular Disease | Admitting: Cardiovascular Disease

## 2024-01-19 VITALS — BP 110/76 | HR 86 | Ht 75.0 in | Wt 259.2 lb

## 2024-01-19 DIAGNOSIS — I2510 Atherosclerotic heart disease of native coronary artery without angina pectoris: Secondary | ICD-10-CM | POA: Diagnosis not present

## 2024-01-19 DIAGNOSIS — Z01810 Encounter for preprocedural cardiovascular examination: Secondary | ICD-10-CM

## 2024-01-19 DIAGNOSIS — E785 Hyperlipidemia, unspecified: Secondary | ICD-10-CM

## 2024-01-19 NOTE — Patient Instructions (Signed)
 Medication Instructions:  You may HOLD Aspirin  as needed for Surgery.   *If you need a refill on your cardiac medications before your next appointment, please call your pharmacy*  Lab Work: NONE  If you have labs (blood work) drawn today and your tests are completely normal, you will receive your results only by: MyChart Message (if you have MyChart) OR A paper copy in the mail If you have any lab test that is abnormal or we need to change your treatment, we will call you to review the results.  Testing/Procedures: NONE  Follow-Up: At [facility], you and your health needs are our priority.  As part of our continuing mission to provide you with exceptional heart care, our providers are all part of one team.  This team includes your primary Cardiologist (physician) and Advanced Practice Providers or APPs (Physician Assistants and Nurse Practitioners) who all work together to provide you with the care you need, when you need it.  Your next appointment:   1 year(s)  Provider:   Vinie JAYSON Maxcy, MD
